# Patient Record
Sex: Male | Born: 1945
Health system: Southern US, Community
[De-identification: ages and names within clinical notes are randomized; demographics above are authoritative.]

## PROBLEM LIST (undated history)

## (undated) DIAGNOSIS — K4090 Unilateral inguinal hernia, without obstruction or gangrene, not specified as recurrent: Secondary | ICD-10-CM

## (undated) DIAGNOSIS — C61 Malignant neoplasm of prostate: Secondary | ICD-10-CM

## (undated) DIAGNOSIS — Z8546 Personal history of malignant neoplasm of prostate: Secondary | ICD-10-CM

## (undated) DIAGNOSIS — H401130 Primary open-angle glaucoma, bilateral, stage unspecified: Secondary | ICD-10-CM

## (undated) DIAGNOSIS — R351 Nocturia: Secondary | ICD-10-CM

## (undated) HISTORY — DX: Unilateral inguinal hernia, without obstruction or gangrene, not specified as recurrent: K40.90

## (undated) HISTORY — DX: Malignant neoplasm of prostate: C61

## (undated) HISTORY — DX: Nocturia: R35.1

## (undated) HISTORY — DX: Primary open-angle glaucoma, bilateral, stage unspecified: H40.1130

## (undated) HISTORY — DX: Personal history of malignant neoplasm of prostate: Z85.46

---

## 2006-03-31 ENCOUNTER — Ambulatory Visit: Payer: Self-pay | Admitting: Unknown Physician Specialty

## 2006-04-03 ENCOUNTER — Ambulatory Visit: Payer: Self-pay | Admitting: Unknown Physician Specialty

## 2008-12-05 ENCOUNTER — Ambulatory Visit: Payer: Self-pay | Admitting: Gastroenterology

## 2010-02-19 ENCOUNTER — Ambulatory Visit: Payer: Self-pay | Admitting: Radiation Oncology

## 2010-03-17 ENCOUNTER — Ambulatory Visit: Payer: Self-pay | Admitting: Radiation Oncology

## 2010-03-21 ENCOUNTER — Ambulatory Visit: Payer: Self-pay | Admitting: Radiation Oncology

## 2010-06-21 ENCOUNTER — Ambulatory Visit: Payer: Self-pay | Admitting: Radiation Oncology

## 2010-06-24 ENCOUNTER — Ambulatory Visit: Payer: Self-pay | Admitting: Radiation Oncology

## 2010-07-22 ENCOUNTER — Ambulatory Visit: Payer: Self-pay | Admitting: Radiation Oncology

## 2010-07-29 ENCOUNTER — Ambulatory Visit: Payer: Self-pay | Admitting: Radiation Oncology

## 2010-08-21 ENCOUNTER — Ambulatory Visit: Payer: Self-pay | Admitting: Radiation Oncology

## 2010-08-21 DIAGNOSIS — C61 Malignant neoplasm of prostate: Secondary | ICD-10-CM

## 2010-08-21 HISTORY — DX: Malignant neoplasm of prostate: C61

## 2010-09-01 ENCOUNTER — Ambulatory Visit: Payer: Self-pay | Admitting: Urology

## 2010-09-21 ENCOUNTER — Ambulatory Visit: Payer: Self-pay | Admitting: Radiation Oncology

## 2010-10-21 ENCOUNTER — Ambulatory Visit: Payer: Self-pay | Admitting: Radiation Oncology

## 2010-11-21 HISTORY — PX: INSERTION BRACHYTHERAPY DEVICE: SHX6582

## 2012-12-31 DIAGNOSIS — K4090 Unilateral inguinal hernia, without obstruction or gangrene, not specified as recurrent: Secondary | ICD-10-CM | POA: Insufficient documentation

## 2012-12-31 HISTORY — DX: Unilateral inguinal hernia, without obstruction or gangrene, not specified as recurrent: K40.90

## 2014-10-02 DIAGNOSIS — Z8546 Personal history of malignant neoplasm of prostate: Secondary | ICD-10-CM

## 2014-10-02 HISTORY — DX: Personal history of malignant neoplasm of prostate: Z85.46

## 2015-03-03 DIAGNOSIS — C61 Malignant neoplasm of prostate: Secondary | ICD-10-CM | POA: Diagnosis not present

## 2015-09-07 DIAGNOSIS — C61 Malignant neoplasm of prostate: Secondary | ICD-10-CM | POA: Diagnosis not present

## 2015-12-29 DIAGNOSIS — Z0000 Encounter for general adult medical examination without abnormal findings: Secondary | ICD-10-CM | POA: Diagnosis not present

## 2015-12-29 DIAGNOSIS — Z0001 Encounter for general adult medical examination with abnormal findings: Secondary | ICD-10-CM | POA: Diagnosis not present

## 2016-09-05 DIAGNOSIS — R351 Nocturia: Secondary | ICD-10-CM | POA: Diagnosis not present

## 2016-09-05 DIAGNOSIS — C61 Malignant neoplasm of prostate: Secondary | ICD-10-CM | POA: Diagnosis not present

## 2016-09-10 DIAGNOSIS — R351 Nocturia: Secondary | ICD-10-CM

## 2016-09-10 HISTORY — DX: Nocturia: R35.1

## 2016-09-19 DIAGNOSIS — Z23 Encounter for immunization: Secondary | ICD-10-CM | POA: Diagnosis not present

## 2016-09-19 DIAGNOSIS — Z1283 Encounter for screening for malignant neoplasm of skin: Secondary | ICD-10-CM | POA: Diagnosis not present

## 2016-09-19 DIAGNOSIS — Z0000 Encounter for general adult medical examination without abnormal findings: Secondary | ICD-10-CM | POA: Diagnosis not present

## 2016-11-07 DIAGNOSIS — D1801 Hemangioma of skin and subcutaneous tissue: Secondary | ICD-10-CM | POA: Diagnosis not present

## 2016-11-07 DIAGNOSIS — L700 Acne vulgaris: Secondary | ICD-10-CM | POA: Diagnosis not present

## 2016-11-07 DIAGNOSIS — D1721 Benign lipomatous neoplasm of skin and subcutaneous tissue of right arm: Secondary | ICD-10-CM | POA: Diagnosis not present

## 2016-11-07 DIAGNOSIS — L821 Other seborrheic keratosis: Secondary | ICD-10-CM | POA: Diagnosis not present

## 2016-12-30 DIAGNOSIS — Z0001 Encounter for general adult medical examination with abnormal findings: Secondary | ICD-10-CM | POA: Diagnosis not present

## 2017-05-22 DIAGNOSIS — H401113 Primary open-angle glaucoma, right eye, severe stage: Secondary | ICD-10-CM | POA: Diagnosis not present

## 2017-05-22 DIAGNOSIS — H40043 Steroid responder, bilateral: Secondary | ICD-10-CM | POA: Diagnosis not present

## 2017-05-22 DIAGNOSIS — H401121 Primary open-angle glaucoma, left eye, mild stage: Secondary | ICD-10-CM | POA: Diagnosis not present

## 2017-05-22 DIAGNOSIS — H2513 Age-related nuclear cataract, bilateral: Secondary | ICD-10-CM | POA: Diagnosis not present

## 2017-07-06 DIAGNOSIS — H401121 Primary open-angle glaucoma, left eye, mild stage: Secondary | ICD-10-CM | POA: Diagnosis not present

## 2017-07-06 DIAGNOSIS — H401113 Primary open-angle glaucoma, right eye, severe stage: Secondary | ICD-10-CM | POA: Diagnosis not present

## 2017-07-06 DIAGNOSIS — H2513 Age-related nuclear cataract, bilateral: Secondary | ICD-10-CM | POA: Diagnosis not present

## 2017-09-20 DIAGNOSIS — H401130 Primary open-angle glaucoma, bilateral, stage unspecified: Secondary | ICD-10-CM | POA: Diagnosis not present

## 2017-09-20 DIAGNOSIS — Z0000 Encounter for general adult medical examination without abnormal findings: Secondary | ICD-10-CM | POA: Diagnosis not present

## 2017-09-20 DIAGNOSIS — C61 Malignant neoplasm of prostate: Secondary | ICD-10-CM | POA: Diagnosis not present

## 2017-09-20 DIAGNOSIS — Z5181 Encounter for therapeutic drug level monitoring: Secondary | ICD-10-CM | POA: Diagnosis not present

## 2017-09-20 HISTORY — DX: Primary open-angle glaucoma, bilateral, stage unspecified: H40.1130

## 2017-11-02 DIAGNOSIS — H401133 Primary open-angle glaucoma, bilateral, severe stage: Secondary | ICD-10-CM | POA: Diagnosis not present

## 2017-11-09 ENCOUNTER — Ambulatory Visit: Payer: Medicare HMO | Admitting: Urology

## 2017-11-09 ENCOUNTER — Encounter: Payer: Self-pay | Admitting: Urology

## 2017-11-09 VITALS — BP 119/66 | HR 76 | Ht 73.0 in | Wt 185.0 lb

## 2017-11-09 DIAGNOSIS — Z8546 Personal history of malignant neoplasm of prostate: Secondary | ICD-10-CM

## 2017-11-09 NOTE — Progress Notes (Signed)
11/09/2017 3:04 PM   Jason Mcgee 1946/03/07 [id number]  Referring provider: No referring provider defined for this encounter.  Chief Complaint  Patient presents with  . Prostate Cancer    HPI: 71 year old male presents for follow-up of prostate cancer.  He was diagnosed with T1c low risk adenocarcinoma the prostate in 2011 and elected brachytherapy as treatment completed in October 2011.  PSA at diagnosis was 4.1.  His PSA has been stable in the 0.5 range.  His last PSA October 2017 was 0.55.  He denies bothersome lower urinary tract symptoms.  He has nocturia x1-2 however states he drinks 8-12 ounces of water just prior to bedtime.  He has also noted slight difficulty achieving and maintaining an erection.  He has no pain or curvature.  PMH: Past Medical History:  Diagnosis Date  . Inguinal hernia 12/31/2012  . Malignant neoplasm of prostate ([place]) 08/21/2010   Overview:  Gleeson Stage 6, s/p 1-125 interstitial implants  . Nocturia 09/10/2016  . Personal history of prostate cancer 10/02/2014  . Primary open angle glaucoma (POAG) of both eyes 09/20/2017    Surgical History: Prostate brachytherapy  Home Medications:  Allergies as of 11/09/2017   No Known Allergies     Medication List        Accurate as of 11/09/17  3:04 PM. Always use your most recent med list.          dorzolamide 2 % ophthalmic solution Commonly known as:  TRUSOPT INSTILL 1 DROP INTO BOTH EYES TWICE A DAY   latanoprost 0.005 % ophthalmic solution Commonly known as:  XALATAN       Allergies: No Known Allergies  Family History: Family History  Problem Relation Age of Onset  . Prostate cancer Neg Hx   . Bladder Cancer Neg Hx   . Kidney cancer Neg Hx     Social History:  reports that  has never smoked. he has never used smokeless tobacco. He reports that he does not drink alcohol or use drugs.  ROS: UROLOGY Frequent Urination?: No Hard to postpone urination?: No Burning/pain  with urination?: No Get up at night to urinate?: Yes Leakage of urine?: No Urine stream starts and stops?: No Trouble starting stream?: No Do you have to strain to urinate?: No Blood in urine?: No Urinary tract infection?: No Sexually transmitted disease?: No Injury to kidneys or bladder?: No Painful intercourse?: No Weak stream?: No Erection problems?: Yes Penile pain?: No  Gastrointestinal Nausea?: No Vomiting?: No Indigestion/heartburn?: No Diarrhea?: No Constipation?: No  Constitutional Fever: No Night sweats?: No Weight loss?: No Fatigue?: No  Skin Skin rash/lesions?: No Itching?: No  Eyes Blurred vision?: Yes Double vision?: No  Ears/Nose/Throat Sore throat?: No Sinus problems?: No  Hematologic/Lymphatic Swollen glands?: No Easy bruising?: No  Cardiovascular Leg swelling?: No Chest pain?: No  Respiratory Cough?: No Shortness of breath?: No  Endocrine Excessive thirst?: No  Musculoskeletal Back pain?: No Joint pain?: No  Neurological Headaches?: No Dizziness?: No  Psychologic Depression?: No Anxiety?: No  Physical Exam: BP 119/66   Pulse 76   Ht 6\' 1"  (1.854 m)   Wt 185 lb (83.9 kg)   BMI 24.41 kg/m   Constitutional:  Alert and oriented, No acute distress. HEENT:  AT, moist mucus membranes.  Trachea midline, no masses. Cardiovascular: No clubbing, cyanosis, or edema. Respiratory: Normal respiratory effort, no increased work of breathing. GI: Abdomen is soft, nontender, nondistended, no abdominal masses GU: No CVA tenderness. Skin: No rashes, bruises or  suspicious lesions. Lymph: No cervical or inguinal adenopathy. Neurologic: Grossly intact, no focal deficits, moving all 4 extremities. Psychiatric: Normal mood and affect.  Laboratory Data: PSA was drawn  Assessment & Plan:    1. History of prostate cancer Doing well.  PSA drawn today in a stable he will continue annual follow-up.  I discussed ED treatments however he  states his problem is currently not bothersome enough that he desires treatment.  - PSA   Return in about 1 year (around 11/09/2018) for Recheck, PSA.  [name], [facility] [address] [place], [place] [address] [phone]

## 2017-11-10 ENCOUNTER — Telehealth: Payer: Self-pay

## 2017-11-10 LAB — PSA: Prostate Specific Ag, Serum: 0.6 ng/mL (ref 0.0–4.0)

## 2017-11-10 NOTE — Telephone Encounter (Signed)
Patient notified of results.

## 2017-11-10 NOTE — Telephone Encounter (Signed)
-----   Message from [name], MD sent at 11/10/2017  7:42 AM EST ----- PSA remains stable at 0.6

## 2018-01-22 DIAGNOSIS — H2513 Age-related nuclear cataract, bilateral: Secondary | ICD-10-CM | POA: Diagnosis not present

## 2018-01-22 DIAGNOSIS — H401113 Primary open-angle glaucoma, right eye, severe stage: Secondary | ICD-10-CM | POA: Diagnosis not present

## 2018-01-22 DIAGNOSIS — H401121 Primary open-angle glaucoma, left eye, mild stage: Secondary | ICD-10-CM | POA: Diagnosis not present

## 2018-07-02 DIAGNOSIS — H401121 Primary open-angle glaucoma, left eye, mild stage: Secondary | ICD-10-CM | POA: Diagnosis not present

## 2018-07-02 DIAGNOSIS — H401113 Primary open-angle glaucoma, right eye, severe stage: Secondary | ICD-10-CM | POA: Diagnosis not present

## 2018-10-08 DIAGNOSIS — H401113 Primary open-angle glaucoma, right eye, severe stage: Secondary | ICD-10-CM | POA: Diagnosis not present

## 2018-10-08 DIAGNOSIS — H401121 Primary open-angle glaucoma, left eye, mild stage: Secondary | ICD-10-CM | POA: Diagnosis not present

## 2018-11-05 ENCOUNTER — Other Ambulatory Visit: Payer: Self-pay

## 2018-11-05 DIAGNOSIS — Z8546 Personal history of malignant neoplasm of prostate: Secondary | ICD-10-CM

## 2018-11-06 ENCOUNTER — Other Ambulatory Visit: Payer: Medicare HMO

## 2018-11-06 DIAGNOSIS — Z8546 Personal history of malignant neoplasm of prostate: Secondary | ICD-10-CM | POA: Diagnosis not present

## 2018-11-07 LAB — PSA: Prostate Specific Ag, Serum: 0.5 ng/mL (ref 0.0–4.0)

## 2018-11-08 ENCOUNTER — Encounter: Payer: Self-pay | Admitting: Urology

## 2018-11-08 ENCOUNTER — Ambulatory Visit: Payer: Medicare HMO | Admitting: Urology

## 2018-11-08 VITALS — BP 126/75 | HR 94 | Ht 73.0 in | Wt 185.4 lb

## 2018-11-08 DIAGNOSIS — N5201 Erectile dysfunction due to arterial insufficiency: Secondary | ICD-10-CM

## 2018-11-08 DIAGNOSIS — Z8546 Personal history of malignant neoplasm of prostate: Secondary | ICD-10-CM

## 2018-11-08 NOTE — Progress Notes (Signed)
11/08/2018 2:28 PM   Jason Mcgee 1946-10-01 [id number]  Referring provider: Baxter Hire, MD [place], [place] [address]  Chief Complaint  Patient presents with  . Follow-up  . hx of prostate cancer   Urologic history: 1.  T1c low risk adenocarcinoma the prostate diagnosed 2011; PSA 4.1 at diagnosis  -Elected treatment status post brachytherapy October 7966  HPI: 72 year old male presents for annual follow-up.  Overall he is doing well.  He denies bothersome lower urinary tract symptoms.  Denies dysuria, gross hematuria or flank/abdominal/pelvic pain.  He notes occasional mild scrotal discomfort with bladder fullness.  His PSA has remained low and stable since treatment.  PSA drawn 11/06/2018 was stable at 0.5.  He notes occasional difficulty achieving an erection.  PMH: Past Medical History:  Diagnosis Date  . Inguinal hernia 12/31/2012  . Malignant neoplasm of prostate ([place]) 08/21/2010   Overview:  Gleeson Stage 6, s/p 1-125 interstitial implants  . Nocturia 09/10/2016  . Personal history of prostate cancer 10/02/2014  . Primary open angle glaucoma (POAG) of both eyes 09/20/2017    Surgical History: Past Surgical History:  Procedure Laterality Date  . INSERTION BRACHYTHERAPY DEVICE  2012   prostate seed implant    Home Medications:  Allergies as of 11/08/2018      Reactions   Timolol Other (See Comments)   CV issues      Medication List       Accurate as of November 08, 2018  2:28 PM. Always use your most recent med list.        dorzolamide 2 % ophthalmic solution Commonly known as:  TRUSOPT INSTILL 1 DROP INTO BOTH EYES TWICE A DAY   latanoprost 0.005 % ophthalmic solution Commonly known as:  XALATAN       Allergies:  Allergies  Allergen Reactions  . Timolol Other (See Comments)    CV issues    Family History: Family History  Problem Relation Age of Onset  . Prostate cancer Neg Hx   . Bladder Cancer Neg Hx     . Kidney cancer Neg Hx     Social History:  reports that he has never smoked. He has never used smokeless tobacco. He reports that he does not drink alcohol or use drugs.  ROS: UROLOGY Frequent Urination?: No Hard to postpone urination?: No Burning/pain with urination?: No Get up at night to urinate?: No Leakage of urine?: No Urine stream starts and stops?: No Trouble starting stream?: No Do you have to strain to urinate?: No Blood in urine?: No Urinary tract infection?: No Sexually transmitted disease?: No Injury to kidneys or bladder?: No Painful intercourse?: No Weak stream?: No Erection problems?: No Penile pain?: No  Gastrointestinal Nausea?: No Vomiting?: No Indigestion/heartburn?: No Diarrhea?: No Constipation?: No  Constitutional Fever: No Night sweats?: No Weight loss?: No Fatigue?: No  Skin Skin rash/lesions?: No Itching?: No  Eyes Blurred vision?: No Double vision?: No  Ears/Nose/Throat Sore throat?: No Sinus problems?: Yes  Hematologic/Lymphatic Swollen glands?: No Easy bruising?: No  Cardiovascular Leg swelling?: No Chest pain?: No  Respiratory Cough?: No Shortness of breath?: No  Endocrine Excessive thirst?: No  Musculoskeletal Back pain?: No Joint pain?: No  Neurological Headaches?: No  Psychologic Depression?: No Anxiety?: No  Physical Exam: BP 126/75 (BP Location: Left Arm, Patient Position: Sitting)   Pulse 94   Ht 6\' 1"  (1.854 m)   Wt 185 lb 6.4 oz (84.1 kg)   BMI 24.46 kg/m  Constitutional:  Alert and oriented, No acute distress. HEENT: [place] AT, moist mucus membranes.  Trachea midline, no masses. Cardiovascular: No clubbing, cyanosis, or edema. Respiratory: Normal respiratory effort, no increased work of breathing. GI: Abdomen is soft, nontender, nondistended, no abdominal masses GU: No CVA tenderness Lymph: No cervical or inguinal lymphadenopathy. Skin: No rashes, bruises or suspicious lesions. Neurologic:  Grossly intact, no focal deficits, moving all 4 extremities. Psychiatric: Normal mood and affect.   Assessment & Plan:   72 year old male with history of prostate cancer status post brachii therapy.  PSA has remained low and stable.  Continue annual follow-up.  Should his erectile dysfunction worsen discussed starting a PDE 5 inhibitor.   Return in about 1 year (around 11/09/2019) for Recheck, PSA.  [name], [facility] [address], [place], [place] [address] [phone]

## 2018-12-25 DIAGNOSIS — H401133 Primary open-angle glaucoma, bilateral, severe stage: Secondary | ICD-10-CM | POA: Diagnosis not present

## 2018-12-25 DIAGNOSIS — Z0001 Encounter for general adult medical examination with abnormal findings: Secondary | ICD-10-CM | POA: Diagnosis not present

## 2018-12-25 DIAGNOSIS — C61 Malignant neoplasm of prostate: Secondary | ICD-10-CM | POA: Diagnosis not present

## 2018-12-25 DIAGNOSIS — Z0000 Encounter for general adult medical examination without abnormal findings: Secondary | ICD-10-CM | POA: Diagnosis not present

## 2019-01-02 DIAGNOSIS — Z0001 Encounter for general adult medical examination with abnormal findings: Secondary | ICD-10-CM | POA: Diagnosis not present

## 2019-01-28 DIAGNOSIS — H401113 Primary open-angle glaucoma, right eye, severe stage: Secondary | ICD-10-CM | POA: Diagnosis not present

## 2019-01-28 DIAGNOSIS — H401121 Primary open-angle glaucoma, left eye, mild stage: Secondary | ICD-10-CM | POA: Diagnosis not present

## 2019-01-28 DIAGNOSIS — H2513 Age-related nuclear cataract, bilateral: Secondary | ICD-10-CM | POA: Diagnosis not present

## 2019-06-03 DIAGNOSIS — H401113 Primary open-angle glaucoma, right eye, severe stage: Secondary | ICD-10-CM | POA: Diagnosis not present

## 2019-06-03 DIAGNOSIS — H401121 Primary open-angle glaucoma, left eye, mild stage: Secondary | ICD-10-CM | POA: Diagnosis not present

## 2019-10-07 DIAGNOSIS — H401113 Primary open-angle glaucoma, right eye, severe stage: Secondary | ICD-10-CM | POA: Diagnosis not present

## 2019-10-07 DIAGNOSIS — H401121 Primary open-angle glaucoma, left eye, mild stage: Secondary | ICD-10-CM | POA: Diagnosis not present

## 2019-11-07 ENCOUNTER — Other Ambulatory Visit: Payer: Self-pay | Admitting: *Deleted

## 2019-11-07 DIAGNOSIS — Z8546 Personal history of malignant neoplasm of prostate: Secondary | ICD-10-CM

## 2019-11-11 ENCOUNTER — Other Ambulatory Visit: Payer: Self-pay

## 2019-11-11 ENCOUNTER — Other Ambulatory Visit: Payer: Medicare HMO

## 2019-11-11 DIAGNOSIS — Z8546 Personal history of malignant neoplasm of prostate: Secondary | ICD-10-CM

## 2019-11-12 LAB — PSA: Prostate Specific Ag, Serum: 0.5 ng/mL (ref 0.0–4.0)

## 2019-11-13 ENCOUNTER — Ambulatory Visit: Payer: Medicare HMO | Admitting: Urology

## 2019-11-19 ENCOUNTER — Ambulatory Visit: Payer: Medicare HMO | Admitting: Urology

## 2019-11-19 ENCOUNTER — Encounter: Payer: Self-pay | Admitting: Urology

## 2019-11-19 ENCOUNTER — Other Ambulatory Visit: Payer: Self-pay

## 2019-11-19 VITALS — BP 127/76 | HR 66 | Ht 73.0 in | Wt 186.0 lb

## 2019-11-19 DIAGNOSIS — Z8546 Personal history of malignant neoplasm of prostate: Secondary | ICD-10-CM

## 2019-11-19 DIAGNOSIS — N529 Male erectile dysfunction, unspecified: Secondary | ICD-10-CM | POA: Insufficient documentation

## 2019-11-19 MED ORDER — TADALAFIL 20 MG PO TABS: ORAL_TABLET | ORAL | 0 refills | Status: DC

## 2019-11-19 NOTE — Progress Notes (Addendum)
11/19/2019 2:54 PM   Jason Mcgee 1946/06/21 [id number]  Referring provider: Baxter Hire, MD [place],  [place] [address]  Chief Complaint  Patient presents with  . Follow-up  . Erectile Dysfunction    Urologic history: 1.  T1c low risk adenocarcinoma the prostate diagnosed 2011; PSA 4.1 at diagnosis             -Elected treatment status post brachytherapy October 2011  HPI: 73 y.o. male presents for annual follow-up.  He denies bothersome lower urinary tract symptoms.  No dysuria, gross hematuria or flank, abdominal, pelvic pain.  Over the past 1-2 years he has noted intermittent ED however it has been worse the past 1-2 months.  He will have difficulty achieving and maintaining erection.  He does relate to some performance anxiety over this problem.  No significant organic risk factors.  Denies tobacco use-previous or current.  PSA drawn earlier this month remains stable at 0.5   PMH: Past Medical History:  Diagnosis Date  . Inguinal hernia 12/31/2012  . Malignant neoplasm of prostate ([place]) 08/21/2010   Overview:  Gleeson Stage 6, s/p 1-125 interstitial implants  . Nocturia 09/10/2016  . Personal history of prostate cancer 10/02/2014  . Primary open angle glaucoma (POAG) of both eyes 09/20/2017    Surgical History: Past Surgical History:  Procedure Laterality Date  . INSERTION BRACHYTHERAPY DEVICE  2012   prostate seed implant    Home Medications:  Allergies as of 11/19/2019      Reactions   Timolol Other (See Comments)   CV issues      Medication List       Accurate as of November 19, 2019  2:54 PM. If you have any questions, ask your nurse or doctor.        Boostrix 5-2.5-18.5 LF-MCG/0.5 injection Generic drug: Tdap   brimonidine 0.2 % ophthalmic solution Commonly known as: ALPHAGAN   dorzolamide 2 % ophthalmic solution Commonly known as: TRUSOPT INSTILL 1 DROP INTO BOTH EYES TWICE A DAY   Fluzone High-Dose  Quadrivalent 0.7 ML Susy Generic drug: Influenza Vac High-Dose Quad   latanoprost 0.005 % ophthalmic solution Commonly known as: XALATAN       Allergies:  Allergies  Allergen Reactions  . Timolol Other (See Comments)    CV issues    Family History: Family History  Problem Relation Age of Onset  . Prostate cancer Neg Hx   . Bladder Cancer Neg Hx   . Kidney cancer Neg Hx     Social History:  reports that he has never smoked. He has never used smokeless tobacco. He reports that he does not drink alcohol or use drugs.  ROS: UROLOGY Frequent Urination?: No Hard to postpone urination?: No Burning/pain with urination?: No Get up at night to urinate?: Yes Leakage of urine?: No Urine stream starts and stops?: No Trouble starting stream?: No Do you have to strain to urinate?: No Blood in urine?: No Urinary tract infection?: No Sexually transmitted disease?: No Injury to kidneys or bladder?: No Painful intercourse?: No Weak stream?: No Erection problems?: Yes Penile pain?: No  Gastrointestinal Nausea?: No Vomiting?: No Indigestion/heartburn?: No Diarrhea?: No Constipation?: No  Constitutional Fever: No Night sweats?: No Weight loss?: No Fatigue?: No  Skin Skin rash/lesions?: No Itching?: No  Eyes Blurred vision?: No Double vision?: No  Ears/Nose/Throat Sore throat?: No Sinus problems?: No  Hematologic/Lymphatic Swollen glands?: No Easy bruising?: No  Cardiovascular Leg swelling?: No Chest pain?: No  Respiratory Cough?: No Shortness of breath?: No  Endocrine Excessive thirst?: No  Musculoskeletal Back pain?: No Joint pain?: No  Neurological Headaches?: No Dizziness?: No  Psychologic Depression?: No Anxiety?: No  Physical Exam: BP 127/76 (BP Location: Left Arm, Patient Position: Sitting, Cuff Size: Normal)   Pulse 66   Ht 6\' 1"  (1.854 m)   Wt 186 lb (84.4 kg)   BMI 24.54 kg/m   Constitutional:  Alert and oriented, No acute  distress. HEENT: [place] AT, moist mucus membranes.  Trachea midline, no masses. Cardiovascular: No clubbing, cyanosis, or edema. Respiratory: Normal respiratory effort, no increased work of breathing. Skin: No rashes, bruises or suspicious lesions. Neurologic: Grossly intact, no focal deficits, moving all 4 extremities. Psychiatric: Normal mood and affect.   Assessment & Plan:    - Personal history prostate cancer PSA remains low and stable.  He will continue annual follow-up.  - Erectile dysfunction No significant organic risk factors.  We discussed the majority of ED is secondary to vascular abnormalities.  We also discussed that new onset ED can be a harbinger of coronary artery disease and would recommend he discuss with Dr. Edwina Barth at his next visit.  He was interested and a trial of PDE 5 inhibitor  Rx Cialis 20 mg 1 tab 1 hour prior to intercourse sent to [facility] with good Rx coupon.     [name], [facility] [address] [place], [place] [address] [phone]

## 2019-11-21 ENCOUNTER — Ambulatory Visit: Payer: Medicare HMO | Admitting: Urology

## 2019-12-31 DIAGNOSIS — Z0001 Encounter for general adult medical examination with abnormal findings: Secondary | ICD-10-CM | POA: Diagnosis not present

## 2019-12-31 DIAGNOSIS — H401133 Primary open-angle glaucoma, bilateral, severe stage: Secondary | ICD-10-CM | POA: Diagnosis not present

## 2019-12-31 DIAGNOSIS — Z23 Encounter for immunization: Secondary | ICD-10-CM | POA: Diagnosis not present

## 2020-01-01 DIAGNOSIS — Z125 Encounter for screening for malignant neoplasm of prostate: Secondary | ICD-10-CM | POA: Diagnosis not present

## 2020-01-01 DIAGNOSIS — Z0001 Encounter for general adult medical examination with abnormal findings: Secondary | ICD-10-CM | POA: Diagnosis not present

## 2020-01-30 DIAGNOSIS — H2513 Age-related nuclear cataract, bilateral: Secondary | ICD-10-CM | POA: Diagnosis not present

## 2020-01-30 DIAGNOSIS — H401121 Primary open-angle glaucoma, left eye, mild stage: Secondary | ICD-10-CM | POA: Diagnosis not present

## 2020-01-30 DIAGNOSIS — H401113 Primary open-angle glaucoma, right eye, severe stage: Secondary | ICD-10-CM | POA: Diagnosis not present

## 2020-06-29 DIAGNOSIS — H401113 Primary open-angle glaucoma, right eye, severe stage: Secondary | ICD-10-CM | POA: Diagnosis not present

## 2020-06-29 DIAGNOSIS — H401121 Primary open-angle glaucoma, left eye, mild stage: Secondary | ICD-10-CM | POA: Diagnosis not present

## 2020-06-29 DIAGNOSIS — H2513 Age-related nuclear cataract, bilateral: Secondary | ICD-10-CM | POA: Diagnosis not present

## 2020-07-09 DIAGNOSIS — Z1211 Encounter for screening for malignant neoplasm of colon: Secondary | ICD-10-CM | POA: Diagnosis not present

## 2020-07-16 LAB — EXTERNAL GENERIC LAB PROCEDURE: COLOGUARD: NEGATIVE

## 2020-07-16 LAB — COLOGUARD: COLOGUARD: NEGATIVE

## 2020-08-19 DIAGNOSIS — Z23 Encounter for immunization: Secondary | ICD-10-CM | POA: Diagnosis not present

## 2020-10-05 DIAGNOSIS — H401121 Primary open-angle glaucoma, left eye, mild stage: Secondary | ICD-10-CM | POA: Diagnosis not present

## 2020-10-05 DIAGNOSIS — H2513 Age-related nuclear cataract, bilateral: Secondary | ICD-10-CM | POA: Diagnosis not present

## 2020-10-05 DIAGNOSIS — H401113 Primary open-angle glaucoma, right eye, severe stage: Secondary | ICD-10-CM | POA: Diagnosis not present

## 2020-11-18 ENCOUNTER — Encounter: Payer: Self-pay | Admitting: Urology

## 2020-11-18 ENCOUNTER — Ambulatory Visit: Payer: Medicare HMO | Admitting: Urology

## 2020-11-18 ENCOUNTER — Other Ambulatory Visit: Payer: Self-pay

## 2020-11-18 VITALS — BP 116/70 | HR 59 | Ht 73.0 in | Wt 180.0 lb

## 2020-11-18 DIAGNOSIS — R972 Elevated prostate specific antigen [PSA]: Secondary | ICD-10-CM | POA: Diagnosis not present

## 2020-11-18 DIAGNOSIS — N5202 Corporo-venous occlusive erectile dysfunction: Secondary | ICD-10-CM | POA: Diagnosis not present

## 2020-11-18 DIAGNOSIS — K4090 Unilateral inguinal hernia, without obstruction or gangrene, not specified as recurrent: Secondary | ICD-10-CM

## 2020-11-18 DIAGNOSIS — C61 Malignant neoplasm of prostate: Secondary | ICD-10-CM

## 2020-11-18 MED ORDER — TADALAFIL 20 MG PO TABS: ORAL_TABLET | ORAL | 0 refills | Status: DC

## 2020-11-18 NOTE — Progress Notes (Signed)
11/18/2020 2:19 PM   Jason Mcgee [birth year] [id number]  Referring provider: Baxter Hire, MD [place],  [place] [address]  Chief Complaint  Patient presents with  . Prostate Cancer    Urologic history: 1.T1c low risk adenocarcinoma the prostate  -diagnosed 2011; PSA 4.1 at diagnosis -Elected treatment status post brachytherapy October 2011  2.  Erectile dysfunction  HPI: 74 y.o. male presents for annual follow-up.  No bothersome LUTS  Denies dysuria, gross hematuria  Since last years visit with worsening erectile dysfunction  Can usually achieve penetration but has difficulty maintaining the erection; no real improvement on tadalafil 20 mg  Also notes delayed seminal emission  No pain with ejaculation  Has also noted a mass in his right groin area that he wanted to have checked   PMH: Past Medical History:  Diagnosis Date  . Inguinal hernia 12/31/2012  . Malignant neoplasm of prostate ([place]) 08/21/2010   Overview:  Gleeson Stage 6, s/p 1-125 interstitial implants  . Nocturia 09/10/2016  . Personal history of prostate cancer 10/02/2014  . Primary open angle glaucoma (POAG) of both eyes 09/20/2017    Surgical History: Past Surgical History:  Procedure Laterality Date  . INSERTION BRACHYTHERAPY DEVICE  2012   prostate seed implant    Home Medications:  Allergies as of 11/18/2020      Reactions   Timolol Other (See Comments)   CV issues      Medication List       Accurate as of November 18, 2020  2:19 PM. If you have any questions, ask your nurse or doctor.        STOP taking these medications   Boostrix 5-2.5-18.5 LF-MCG/0.5 injection Generic drug: Tdap Stopped by: [name], MD   Fluzone High-Dose Quadrivalent 0.7 ML Susy Generic drug: Influenza Vac High-Dose Quad Stopped by: [name], MD   tadalafil 20 MG tablet Commonly known as: CIALIS Stopped by: [name], MD     TAKE these  medications   brimonidine 0.2 % ophthalmic solution Commonly known as: ALPHAGAN   dorzolamide 2 % ophthalmic solution Commonly known as: TRUSOPT INSTILL 1 DROP INTO BOTH EYES TWICE A DAY   latanoprost 0.005 % ophthalmic solution Commonly known as: XALATAN       Allergies:  Allergies  Allergen Reactions  . Timolol Other (See Comments)    CV issues    Family History: Family History  Problem Relation Age of Onset  . Prostate cancer Neg Hx   . Bladder Cancer Neg Hx   . Kidney cancer Neg Hx     Social History:  reports that he has never smoked. He has never used smokeless tobacco. He reports that he does not drink alcohol and does not use drugs.   Physical Exam: BP 116/70   Pulse (!) 59   Ht 6\' 1"  (1.854 m)   Wt 180 lb (81.6 kg)   BMI 23.75 kg/m   Constitutional:  Alert and oriented, No acute distress. HEENT: [place] AT, moist mucus membranes.  Trachea midline, no masses. Cardiovascular: No clubbing, cyanosis, or edema. Respiratory: Normal respiratory effort, no increased work of breathing. GU: Easily reducible right inguinal hernia Skin: No rashes, bruises or suspicious lesions. Neurologic: Grossly intact, no focal deficits, moving all 4 extremities. Psychiatric: Normal mood and affect.   Assessment & Plan:    1.  T1c low risk prostate cancer  Doing well status post brachytherapy  PSA drawn today  2.  Erectile dysfunction  Able to achieve penetration though difficulty maintaining which may be secondary to venoocclusive disease  Recommended a trial of a venous compression band  He may also use in conjunction with tadalafil and refill sent to pharmacy  He will call back regarding efficacy  3.  Right inguinal hernia  Asymptomatic and not interested in repair or general surgical referral  Discussed signs of incarceration and if occur would need urgent evaluation   Continue annual follow-up   Riki Altes, MD  [facility] [address], [address] [place], [place] [address] [phone]

## 2020-11-19 LAB — PSA: Prostate Specific Ag, Serum: 0.6 ng/mL (ref 0.0–4.0)

## 2020-11-21 ENCOUNTER — Encounter: Payer: Self-pay | Admitting: Urology

## 2020-11-21 DIAGNOSIS — N5202 Corporo-venous occlusive erectile dysfunction: Secondary | ICD-10-CM | POA: Insufficient documentation

## 2020-11-23 ENCOUNTER — Telehealth: Payer: Self-pay | Admitting: *Deleted

## 2020-11-23 NOTE — Telephone Encounter (Signed)
Notified patient as instructed, patient pleased. Discussed follow-up appointments, patient agrees  

## 2020-11-23 NOTE — Telephone Encounter (Signed)
-----   Message from Riki Altes, MD sent at 11/23/2020 10:21 AM EST ----- PSA stable 0.6

## 2020-11-26 DIAGNOSIS — H401113 Primary open-angle glaucoma, right eye, severe stage: Secondary | ICD-10-CM | POA: Diagnosis not present

## 2020-12-23 DIAGNOSIS — Z0001 Encounter for general adult medical examination with abnormal findings: Secondary | ICD-10-CM | POA: Diagnosis not present

## 2020-12-23 DIAGNOSIS — Z13220 Encounter for screening for lipoid disorders: Secondary | ICD-10-CM | POA: Diagnosis not present

## 2021-01-01 DIAGNOSIS — H401133 Primary open-angle glaucoma, bilateral, severe stage: Secondary | ICD-10-CM | POA: Diagnosis not present

## 2021-01-01 DIAGNOSIS — Z8546 Personal history of malignant neoplasm of prostate: Secondary | ICD-10-CM | POA: Diagnosis not present

## 2021-01-01 DIAGNOSIS — Z0000 Encounter for general adult medical examination without abnormal findings: Secondary | ICD-10-CM | POA: Diagnosis not present

## 2021-01-01 DIAGNOSIS — H8113 Benign paroxysmal vertigo, bilateral: Secondary | ICD-10-CM | POA: Diagnosis not present

## 2021-01-01 DIAGNOSIS — Z0001 Encounter for general adult medical examination with abnormal findings: Secondary | ICD-10-CM | POA: Diagnosis not present

## 2021-01-22 ENCOUNTER — Other Ambulatory Visit: Payer: Self-pay | Admitting: Otolaryngology

## 2021-01-22 DIAGNOSIS — R42 Dizziness and giddiness: Secondary | ICD-10-CM

## 2021-01-22 DIAGNOSIS — H903 Sensorineural hearing loss, bilateral: Secondary | ICD-10-CM | POA: Diagnosis not present

## 2021-01-28 DIAGNOSIS — H401133 Primary open-angle glaucoma, bilateral, severe stage: Secondary | ICD-10-CM | POA: Diagnosis not present

## 2021-02-08 ENCOUNTER — Ambulatory Visit
Admission: RE | Admit: 2021-02-08 | Discharge: 2021-02-08 | Disposition: A | Discharge status: Home / Self Care | Payer: Medicare HMO | Source: Ambulatory Visit | Attending: Otolaryngology | Admitting: Otolaryngology

## 2021-02-08 ENCOUNTER — Other Ambulatory Visit: Payer: Self-pay

## 2021-02-08 DIAGNOSIS — R42 Dizziness and giddiness: Secondary | ICD-10-CM | POA: Diagnosis not present

## 2021-02-08 DIAGNOSIS — R519 Headache, unspecified: Secondary | ICD-10-CM | POA: Diagnosis not present

## 2021-02-08 MED ORDER — GADOBUTROL 1 MMOL/ML IV SOLN
8.0000 mL | Freq: Once | INTRAVENOUS | Status: AC | PRN
  Administered 2021-02-08: 8 mL via INTRAVENOUS

## 2021-02-24 DIAGNOSIS — R42 Dizziness and giddiness: Secondary | ICD-10-CM | POA: Diagnosis not present

## 2021-03-17 DIAGNOSIS — H8109 Meniere's disease, unspecified ear: Secondary | ICD-10-CM | POA: Diagnosis not present

## 2021-03-17 DIAGNOSIS — J301 Allergic rhinitis due to pollen: Secondary | ICD-10-CM | POA: Diagnosis not present

## 2021-04-29 DIAGNOSIS — H401133 Primary open-angle glaucoma, bilateral, severe stage: Secondary | ICD-10-CM | POA: Diagnosis not present

## 2021-07-13 DIAGNOSIS — H8101 Meniere's disease, right ear: Secondary | ICD-10-CM | POA: Diagnosis not present

## 2021-07-13 DIAGNOSIS — J301 Allergic rhinitis due to pollen: Secondary | ICD-10-CM | POA: Diagnosis not present

## 2021-08-25 DIAGNOSIS — J301 Allergic rhinitis due to pollen: Secondary | ICD-10-CM | POA: Diagnosis not present

## 2021-08-25 DIAGNOSIS — H8101 Meniere's disease, right ear: Secondary | ICD-10-CM | POA: Diagnosis not present

## 2021-09-02 DIAGNOSIS — H401113 Primary open-angle glaucoma, right eye, severe stage: Secondary | ICD-10-CM | POA: Diagnosis not present

## 2021-11-02 DIAGNOSIS — U071 COVID-19: Secondary | ICD-10-CM | POA: Diagnosis not present

## 2021-11-18 ENCOUNTER — Encounter: Payer: Self-pay | Admitting: Urology

## 2021-11-18 ENCOUNTER — Ambulatory Visit: Payer: Medicare HMO | Admitting: Urology

## 2021-11-18 ENCOUNTER — Other Ambulatory Visit: Payer: Self-pay

## 2021-11-18 VITALS — BP 124/77 | HR 89 | Ht 73.0 in | Wt 180.0 lb

## 2021-11-18 DIAGNOSIS — C61 Malignant neoplasm of prostate: Secondary | ICD-10-CM | POA: Diagnosis not present

## 2021-11-18 DIAGNOSIS — R399 Unspecified symptoms and signs involving the genitourinary system: Secondary | ICD-10-CM

## 2021-11-18 DIAGNOSIS — N5319 Other ejaculatory dysfunction: Secondary | ICD-10-CM | POA: Diagnosis not present

## 2021-11-18 LAB — URINALYSIS, COMPLETE
Bilirubin, UA: NEGATIVE
Glucose, UA: NEGATIVE
Ketones, UA: NEGATIVE
Leukocytes,UA: NEGATIVE
Nitrite, UA: NEGATIVE
Protein,UA: NEGATIVE
RBC, UA: NEGATIVE
Specific Gravity, UA: 1.025 (ref 1.005–1.030)
Urobilinogen, Ur: 0.2 mg/dL (ref 0.2–1.0)
pH, UA: 5.5 (ref 5.0–7.5)

## 2021-11-18 LAB — BLADDER SCAN AMB NON-IMAGING: SCA Result: 0

## 2021-11-18 LAB — MICROSCOPIC EXAMINATION
Bacteria, UA: NONE SEEN
Epithelial Cells (non renal): NONE SEEN /hpf (ref 0–10)

## 2021-11-18 NOTE — Progress Notes (Signed)
11/18/2021 2:32 PM   Jason Mcgee 1946-07-22 [id number]  Referring provider: Baxter Hire, MD [place],  [place] [address]  Chief Complaint  Patient presents with   Prostate Cancer   Urologic history: 1.  T1c low risk adenocarcinoma the prostate  -diagnosed 2011; PSA 4.1 at diagnosis -Elected treatment status post brachytherapy October 2011   2.  Erectile dysfunction  HPI: 75 y.o. male who presents for annual follow-up.  Doing well since last visit He has noted since his visit last year mild decreased force and caliber of his urinary stream Denies dysuria, gross hematuria Denies flank, abdominal or pelvic pain He also notes after orgasm some delay in appearance of the ejaculate.  No pain with ejaculation  PMH: Past Medical History:  Diagnosis Date   Inguinal hernia 12/31/2012   Malignant neoplasm of prostate ([name]) 08/21/2010   Overview:  [name] Stage 6, s/p 1-125 interstitial implants   Nocturia 09/10/2016   Personal history of prostate cancer 10/02/2014   Primary open angle glaucoma (POAG) of both eyes 09/20/2017    Surgical History: Past Surgical History:  Procedure Laterality Date   INSERTION BRACHYTHERAPY DEVICE  2012   prostate seed implant    Home Medications:  Allergies as of 11/18/2021       Reactions   Timolol Other (See Comments)   CV issues        Medication List        Accurate as of November 18, 2021  2:32 PM. If you have any questions, ask your nurse or doctor.          brimonidine 0.2 % ophthalmic solution Commonly known as: ALPHAGAN   dorzolamide 2 % ophthalmic solution Commonly known as: TRUSOPT INSTILL 1 DROP INTO BOTH EYES TWICE A DAY   latanoprost 0.005 % ophthalmic solution Commonly known as: XALATAN   tadalafil 20 MG tablet Commonly known as: CIALIS 1 tab po as needed 1 hour prior to intercourse        Allergies:  Allergies  Allergen Reactions   Timolol Other (See Comments)     CV issues    Family History: Family History  Problem Relation Age of Onset   Prostate cancer Neg Hx    Bladder Cancer Neg Hx    Kidney cancer Neg Hx     Social History:  reports that he has never smoked. He has never used smokeless tobacco. He reports that he does not drink alcohol and does not use drugs.   Physical Exam: BP 124/77    Pulse 89    Ht 6\' 1"  (1.854 m)    Wt 180 lb (81.6 kg)    BMI 23.75 kg/m   Constitutional:  Alert and oriented, No acute distress. HEENT: [name], moist mucus membranes.  Trachea midline, no masses. Cardiovascular: No clubbing, cyanosis, or edema. Respiratory: Normal respiratory effort, no increased work of breathing. Psychiatric: Normal mood and affect.  Laboratory Data:  Urinalysis Appearance yellow, clear Dipstick negative Microscopy negative   Assessment & Plan:    1.  T1c low risk prostate cancer Status post brachytherapy PSA today  2.  Lower urinary tract symptoms Mild obstructive voiding symptoms Presently not bothersome Bladder scan PVR 0 mL If his voiding symptoms worsen he was instructed to call and would recommend cystoscopy to evaluate for possibility of urethral stricture  3.  Delayed seminal emission Could potentially be related to a urethral stricture as above but also secondary to prior prostate brachytherapy  [name], [facility] [address], [place], [place] [address] [phone]

## 2021-11-19 ENCOUNTER — Telehealth: Payer: Self-pay | Admitting: *Deleted

## 2021-11-19 DIAGNOSIS — C61 Malignant neoplasm of prostate: Secondary | ICD-10-CM

## 2021-11-19 LAB — PSA: Prostate Specific Ag, Serum: 1 ng/mL (ref 0.0–4.0)

## 2021-11-19 NOTE — Telephone Encounter (Signed)
-----   Message from [name], MD sent at 11/19/2021  7:15 AM EST ----- PSA has increased slightly and was 1.0.  Recommend 40-month lab visit with repeat PSA

## 2021-11-23 DIAGNOSIS — J301 Allergic rhinitis due to pollen: Secondary | ICD-10-CM | POA: Diagnosis not present

## 2021-11-23 DIAGNOSIS — H8101 Meniere's disease, right ear: Secondary | ICD-10-CM | POA: Diagnosis not present

## 2021-11-23 NOTE — Telephone Encounter (Signed)
Notified patient as instructed, patient pleased. Discussed follow-up appointments, patient agrees  

## 2021-11-30 ENCOUNTER — Ambulatory Visit: Payer: Medicare HMO | Attending: Otolaryngology | Admitting: Physical Therapy

## 2021-11-30 ENCOUNTER — Encounter: Payer: Self-pay | Admitting: Physical Therapy

## 2021-11-30 ENCOUNTER — Other Ambulatory Visit: Payer: Self-pay

## 2021-11-30 DIAGNOSIS — R2681 Unsteadiness on feet: Secondary | ICD-10-CM | POA: Insufficient documentation

## 2021-11-30 DIAGNOSIS — R42 Dizziness and giddiness: Secondary | ICD-10-CM | POA: Diagnosis not present

## 2021-11-30 NOTE — Therapy (Signed)
[place] MAIN [facility] SERVICES [address] [place], [place], [address] Phone: [phone]   Fax:  [phone]  Physical Therapy Evaluation  Patient Details  Name: Jason Mcgee MRN: [id number] Date of Birth: 1946-07-08 Referring Provider (PT): Dr. Carmin Richmond   Encounter Date: 11/30/2021   PT End of Session - 11/30/21 1544     Visit Number 1    Number of Visits 9    Date for PT Re-Evaluation 01/25/22    PT Start Time 1304    PT Stop Time 1410    PT Time Calculation (min) 66 min    Equipment Utilized During Treatment Gait belt    Activity Tolerance Patient tolerated treatment well    Behavior During Therapy [facility] for tasks assessed/performed             Past Medical History:  Diagnosis Date   Inguinal hernia 12/31/2012   Malignant neoplasm of prostate ([place]) 08/21/2010   Overview:  [name] Stage 6, s/p 1-125 interstitial implants   Nocturia 09/10/2016   Personal history of prostate cancer 10/02/2014   Primary open angle glaucoma (POAG) of both eyes 09/20/2017    Past Surgical History:  Procedure Laterality Date   INSERTION BRACHYTHERAPY DEVICE  2012   prostate seed implant    There were no vitals filed for this visit.     [facility] PT Assessment - 11/30/21 1345       Assessment   Medical Diagnosis dizziness and giddiness    Referring Provider (PT) Dr. Carmin Richmond    Prior Therapy no prior vestibular therapy      Precautions   Precautions None      Restrictions   Weight Bearing Restrictions No      Balance Screen   Has the patient fallen in the past 6 months No    Has the patient had a decrease in activity level because of a fear of falling?  No    Is the patient reluctant to leave their home because of a fear of falling?  No      Home Environment   Living Environment Private residence    Living Arrangements Spouse/significant other    Available Help at Discharge Family;Friend(s)    Type of [place] to enter    Entrance Stairs-Number of Steps 2    Home Layout Multi-level      Prior Function   Level of Independence Independent with community mobility without device;Independent with gait;Independent with transfers;Independent      Cognition   Overall Cognitive Status Within Functional Limits for tasks assessed      Standardized Balance Assessment   Standardized Balance Assessment Dynamic Gait Index      Dynamic Gait Index   Level Surface Normal    Change in Gait Speed Normal    Gait with Horizontal Head Turns Mild Impairment    Gait with Vertical Head Turns Normal    Gait and Pivot Turn Normal    Step Over Obstacle Mild Impairment    Step Around Obstacles Normal    Steps Mild Impairment    Total Score 21             PCP: Baxter Hire, MD REFERRING PROVIDER: Carloyn Manner, MD  Vestibular Evaluation SUBJECTIVE:   SUBJECTIVE STATEMENT: Patient states that he has had dizziness episodes for years, but states that his symptoms of gotten more prevalent in the past 2 to 3 years.                                                                                                                                                                                                              PERTINENT HISTORY:  Pt reports he has had dizziness off and on for years, but it has become more prevalent in the past 2 or 3 years. Patient has been seen by Dr. Pryor Ochoa, ENT physician, and has had VNG testing. Pt with diagnosis of Meniere's disease, acute vestibular right ear per MR. Patient states he is having vertigo and balance difficulties which prompted him to see Dr. Pryor Ochoa. Pt reports his episodes of dizziness have been happening every 3-5 months. During an episode, pt reports he cannot walk in a straight line and feels like he is going to fall. Patient plays tennis and when he has an episode he cannot play. Pt reports that bending over, standing up,  looking up at the ceiling, looking up to serve the tennis ball, quick turns, turning around all aggravate his symptoms and that resting, slowing his movements and taking sinus medicine help to decrease his symptoms. These episodes last a few days and he feels fatigued and wants to lie down. He has had 2 episodes this past year. Patient states he uses rails when navigating on stairs for balance. Patient reports that he does not have any hearing issues. Pt states that the unsteadiness makes him unsure and that it affects how he walks. Reports he has difficulty distinguishing sinus difficulties acting up and gets stuffy feeling. Pt reports that he has glaucoma and is not sure if some of symptoms could be due to glaucoma or sinus issues. Pt reports puffy sensation behind his eyes. Pt reports that he has had blood work for allergy testing at Dr. Darien Ramus office but has not had appointment to review results yet.  Ears symptoms: no pain or drainage Eyes symptoms: reports when having the severe vertigo gets blurry and "almost see double if I am lying on my side"; reports his eyes water a lot; seen eye doctor within last year and sees for the glaucoma 2-3 times a year.    Type of dizziness: Imbalance (Disequilibrium), Spinning/Vertigo, and Unsteady with head/body turns   Frequency: 2-3 times a year   Duration: days   Aggravating factors: Induced by position  change: sit to stand and bending over and Induced by motion: occur when walking, looking up at the ceiling, bending down to the ground, turning body  quickly, and turning head quickly  turning, sudden movements, bending over and standing upfear of heights so unsure when standing at top of stairs and coming down if he does not have a railing.    Relieving factors: rest and slow movements taking sinus medicine which he takes as needed; slowing movements   Progression of symptoms: worse  PAIN:  Are you having pain? No  PRECAUTIONS: None  WEIGHT BEARING  RESTRICTIONS No  FALLS: Has patient fallen in last 6 months? No, Number of falls: 0  LIVING ENVIRONMENT: Lives with: lives with his spouse Lives in: house Stairs: Yes; External: 2 steps; none; has a basement and a second floor 1 flight of steps with railings Has following equipment at home: None  PLOF: Independent with community mobility without device  PATIENT GOALS : anything to help him deal with it.  Plays two to three times a week and would like to be able to do overhand serve with decreased or no symptoms  OBJECTIVE:   COGNITION: Overall cognitive status: Within functional limits for tasks assessed Commands: Follows multi-step commands consistently Attention: Appears intact Awareness: Appears intact Behavior: Within functional limits   SENSATION: Light touch: Appears intact; denies numbness and tingling   POSTURE: No Significant postural limitations   Cervical AROM/PROM:   Cervical AROM WFL left and right rotation  STRENGTH: deferred   TRANSFERS: Independent with sit to stand transfers  GAIT: Gait pattern: [facility] Assistive device utilized: None Level of assistance: Complete Independence Comments: Ambulates with step through gait pattern with reciprocal arm swing without veering on firm surface with fair scanning of the visual environment.   FUNCTIONAL TESTs:  Clinical Test of Sensory Interaction for Balance (CTSIB):  CONDITION TIME STRATEGY SWAY  Eyes open, firm surface 30 seconds ankle +1  Eyes closed, firm surface 30 seconds ankle +2  Eyes open, foam surface 30 seconds ankle +2  Eyes closed, foam surface 30 seconds Ankle, hip +3    FUNCTIONAL OUTCOME MEASURES:  Results Comments  DHI 16/100 Low perception of handicap  ABC Scale 92% Safe for community mobility  DGI 21/24 Falls risk; in need of intervention  FOTO 65/100 Like patients nationally had a score of 56/100    VESTIBULAR ASSESSMENT    OCULOMOTOR EXAM:   Ocular Alignment: normal   Ocular  ROM: No Limitations   Spontaneous Nystagmus: absent   Gaze-Induced Nystagmus: absent   Smooth Pursuits: intact   Saccades:  mild decreased speed with upper field good rest of the fields   VESTIBULAR - OCULAR REFLEX:    VOR Cancellation: Normal   Head-Impulse Test: HIT Right: one corrective saccade noted HIT Left: negative    POSITIONAL TESTING: Other: deferred   VESTIBULAR TREATMENT: Gaze Adaptation:   x1 Viewing Horizontal: Position: 1 rep in sitting and 1 rep in standing with plain background, Time: 1 minute, Reps: 2 reps, and Comment: pt denied dizziness  Neuromuscular Re-education:  VOR X 1 exercise:  Demonstrated and educated as to VOR X1.  Patient performed VOR X 1 horizontal in sitting 1 rep of 1 minute with verbal cues for technique. Pt had difficulty initially with keeping eyes fixed on the target.  Patient performed VOR X 1 horizontal in standing 1 rep of 1 minute. Pt denied dizziness.   Access Code: [facility] URL: [email or web address] Date: 11/30/2021 Prepared by: Lady Deutscher  Exercises Standing Gaze Stabilization with Head Rotation - 3 x daily - 7 x weekly - 1 sets - 3 reps - 1 minute hold   PT Education - 11/30/21 1519     Education Details discussed plan of care and goals; issued VOR X 1 in standing with plain background via [place]; provided Meniere's disease handout from [email or web address]    Person(s) Educated Patient    Methods Explanation;Demonstration;Handout    Comprehension Verbalized understanding;Returned demonstration              PT Short Term Goals - 11/30/21 1548       PT SHORT TERM GOAL #1   Title Patient will be independent with home exercise program for self-management of symptoms.    Time 4    Period Weeks    Status New    Target Date 12/28/21               PT Long Term Goals - 11/30/21 1548       PT LONG TERM GOAL #1   Title Patient will demonstrate reduced falls risk as evidenced by Dynamic Gait Index  (DGI) 22/24 or greater.    Baseline scored 21/24 on 11/29/21;    Time 8    Period Weeks    Status New    Target Date 01/25/22      PT LONG TERM GOAL #2   Title Patient will report 50% or greater improvement in his symptoms of dizziness and imbalance with provoking motions or positions.    Baseline On 11/30/21 pt reports that he gets dizziness with overhand serves in tennis and with bending over and turning around;    Time 8    Period Weeks    Status New    Target Date 01/25/22              Plan - 11/30/21 1546     Clinical Impression Statement This is a 76 y/o patient who was seen today for physical therapy vestibular evaluation and treatment for dizziness and imbalance symptoms. Objective impairments include decreased balance and dizziness. Patient reports episodic imbalance and dizziness symptoms brought on by positional changes. Patient scored 21/24 on the Dynamic gait index. Patient will benefit from skilled PT to address functional impairments and improve overall function.    Personal Factors and Comorbidities Age    Examination-Activity Limitations Stairs    Examination-Participation Restrictions Other   playing tennis at times   Stability/Clinical Decision Making Evolving/Moderate complexity    Clinical Decision Making Moderate    Rehab Potential Excellent    PT Frequency 1x / week    PT Duration 8 weeks    PT Treatment/Interventions Canalith Repostioning;Gait training;Stair training;Therapeutic activities;Therapeutic exercise;Balance training;Neuromuscular re-education;Patient/family education;Vestibular    PT Next Visit Plan review and progress VOR X 1 exercise-try vertical; Airex pad feet together and semi-tandem progressions with head turns, EO/EC, ball pass over shoulder while ambulating, body wall rolls    PT Home Exercise Plan VOR X1 with plain background in standing    Consulted and Agree with Plan of Care Patient             Patient will benefit from skilled  therapeutic intervention in order to improve the following deficits and impairments:  Dizziness, Decreased balance  Visit Diagnosis: Dizziness and giddiness  Unsteadiness on feet     Problem List Patient Active Problem List   Diagnosis Date Noted   Corporo-venous occlusive erectile dysfunction 11/21/2020   Erectile dysfunction 11/19/2019   Erectile dysfunction  due to arterial insufficiency 11/08/2018   Primary open angle glaucoma (POAG) of both eyes 09/20/2017   Nocturia 09/10/2016   Personal history of prostate cancer 10/02/2014   Right inguinal hernia 12/31/2012   Prostate cancer ([place]) 08/21/2010   Lady Deutscher PT, DPT #[id number] Lady Deutscher, PT 12/01/2021, 2:24 PM  [place] MAIN [facility] SERVICES [address] [place], [place], [address] Phone: [phone]   Fax:  [phone]  Name: Jason Mcgee MRN: [id number] Date of Birth: May 28, 1946

## 2021-12-07 ENCOUNTER — Ambulatory Visit: Payer: Medicare HMO | Admitting: Physical Therapy

## 2021-12-14 ENCOUNTER — Other Ambulatory Visit: Payer: Self-pay

## 2021-12-14 ENCOUNTER — Ambulatory Visit: Payer: Medicare HMO | Admitting: Physical Therapy

## 2021-12-14 ENCOUNTER — Encounter: Payer: Self-pay | Admitting: Physical Therapy

## 2021-12-14 DIAGNOSIS — R2681 Unsteadiness on feet: Secondary | ICD-10-CM

## 2021-12-14 DIAGNOSIS — R42 Dizziness and giddiness: Secondary | ICD-10-CM

## 2021-12-14 NOTE — Therapy (Addendum)
[place] MAIN [facility] SERVICES [address] [place], [place], [address] Phone: [phone]   Fax:  [phone]  Physical Therapy Treatment  Patient Details  Name: Jason Mcgee MRN: [id number] Date of Birth: Aug 02, 1946 Referring Provider (PT): Dr. Carmin Richmond   Encounter Date: 12/14/2021   PT End of Session - 12/14/21 1054     Visit Number 2    Number of Visits 9    Date for PT Re-Evaluation 01/25/22    PT Start Time 1055    Equipment Utilized During Treatment Gait belt    Activity Tolerance Patient tolerated treatment well    Behavior During Therapy [facility] for tasks assessed/performed             Past Medical History:  Diagnosis Date   Inguinal hernia 12/31/2012   Malignant neoplasm of prostate ([place]) 08/21/2010   Overview:  [name] Stage 6, s/p 1-125 interstitial implants   Nocturia 09/10/2016   Personal history of prostate cancer 10/02/2014   Primary open angle glaucoma (POAG) of both eyes 09/20/2017    Past Surgical History:  Procedure Laterality Date   INSERTION BRACHYTHERAPY DEVICE  2012   prostate seed implant    There were no vitals filed for this visit.    Subjective Assessment - 12/17/21 0810     Subjective Patient states that he is still playing tennis and he has had no bouts of dizziness lately.  She reports that he has been taking the medication that his physician gave him which is helping to decrease his sinus congestion.  Patient reports that he has done some of his home exercises.    Pertinent History Pt reports he has had dizziness off and on for years, but it has become more prevalent in the past 2 or 3 years. Patient has been seen by Dr. Pryor Ochoa, ENT physician, and has had VNG testing. Pt with diagnosis of Meniere's disease, acute vestibular right ear per MR. Patient states he is having vertigo and balance difficulties which prompted him to see Dr. Pryor Ochoa. Pt reports his episodes of dizziness have been happening  every 3-5 months. During an episode, pt reports he cannot walk in a straight line and feels like he is going to fall. Patient plays tennis and when he has an episode he cannot play. Pt reports that bending over, standing up, looking up at the ceiling, looking up to serve the tennis ball, quick turns, turning around all aggravate his symptoms and that resting, slowing his movements and taking sinus medicine help to decrease his symptoms. These episodes last a few days and he feels fatigued and wants to lie down. He has had 2 episodes this past year. Patient states he uses rails when navigating on stairs for balance. Patient reports that he does not have any hearing issues. Pt states that the unsteadiness makes him unsure and that it affects how he walks. Reports he has difficulty distinguishing sinus difficulties acting up and gets stuffy feeling. Pt reports that he has glaucoma and is not sure if some of symptoms could be due to glaucoma or sinus issues. Pt reports puffy sensation behind his eyes.    Diagnostic tests VNG test    Patient Stated Goals to have improve balance and decreased episodes of vertigo and unsteadiness; to be able to continue playing tennis.              Neuromuscular Re-education:  VOR X 1 exercise:  Patient performed VOR X 1 horizontal in standing  with conflicting background 3 reps of 1 minute each with verbal cues for technique initially.  Added this progression to home exercise program.  Airex pad:  On Airex pad, patient performed feet together progressions and semi-tandem progressions with alternating lead leg with and without horizontal,  vertical and diagonal head turns.  Patient reports mild increase in dizziness and unsteadiness. Added these exercises to home exercise program.  Ambulation with head turns:  Patient performed 70' trials of forwards and retro ambulation with horizontal and vertical head turns with CGA.  Patient describes his dizziness as he feels  about 20% "off " while doing these activities.  Diona Foley toss to self:  Patient performed forward and then retroambulation 75 feet trials while tossing ball to self horizontal and then vertical while tracking ball with head and eyes with contact-guard assistance. Patient reports feeling about 20% "off "with this activity.   Body Wall Rolls:  Patient performed 4 reps of unsupported, body wall rolls with eyes open contact-guard assistance.  Patient had a few times where he touched the wall for support. Patient reports this activity increased dizziness/sensation of feeling off.   Access Code: [facility] URL: [email or web address] Date: 12/14/2021 Prepared by: Lady Deutscher  Exercises Standing Gaze Stabilization with Head Rotation - 3 x daily - 7 x weekly - 1 sets - 3 reps - 1 minute hold Feet together with horizontal head turns - 1 x daily - 7 x weekly - 1 sets - 5 reps - 1 minute hold Tandem stance with vertical head turns - 1 x daily - 7 x weekly - 1 sets - 5 reps - 1 minute hold Tandem stance with horizontal head turns - 1 x daily - 7 x weekly - 1 sets - 5 reps - 1 min hold        PT Short Term Goals - 11/30/21 1548       PT SHORT TERM GOAL #1   Title Patient will be independent with home exercise program for self-management of symptoms.    Time 4    Period Weeks    Status New    Target Date 12/28/21               PT Long Term Goals - 11/30/21 1548       PT LONG TERM GOAL #1   Title Patient will demonstrate reduced falls risk as evidenced by Dynamic Gait Index (DGI) 22/24 or greater.    Baseline scored 21/24 on 11/29/21;    Time 8    Period Weeks    Status New    Target Date 01/25/22      PT LONG TERM GOAL #2   Title Patient will report 50% or greater improvement in his symptoms of dizziness and imbalance with provoking motions or positions.    Baseline On 11/30/21 pt reports that he gets dizziness with overhand serves in tennis and with bending over and  turning around;    Time 8    Period Weeks    Status New    Target Date 01/25/22               Plan - 12/17/21 0819     Clinical Impression Statement Patient returns to clinic reporting that he has had no further episodes of dizziness since last visit.  Patient reports that his been able to remain active playing tennis.  He was able to progress to VOR x1 in standing with conflicting background this date.  Patient was challenged by Airex pad  and ambulation activities with horizontal and vertical head turns as well as by ambulation with ball toss to self with head and eye follows and body wall rolls.  Patient reports that these activities recreated his sensation of feeling "off ".  Next session will plan on reviewing home exercise program, working on progressions of body wall rolls and ambulation with ball toss over shoulder.  Patient would benefit from continued vestibular PT services to further address goals and functional deficits.   Personal Factors and Comorbidities Age    Examination-Activity Limitations Stairs    Examination-Participation Restrictions Other   playing tennis at times   Stability/Clinical Decision Making Evolving/Moderate complexity    Rehab Potential Excellent    PT Frequency 1x / week    PT Duration 8 weeks    PT Treatment/Interventions Canalith Repostioning;Gait training;Stair training;Therapeutic activities;Therapeutic exercise;Balance training;Neuromuscular re-education;Patient/family education;Vestibular    PT Next Visit Plan review and progress VOR X 1 exercise-try vertical; Airex pad feet together and semi-tandem progressions with head turns, EO/EC, ball pass over shoulder while ambulating, body wall rolls    PT Home Exercise Plan VOR X1 with plain background in standing    Consulted and Agree with Plan of Care Patient                   Patient will benefit from skilled therapeutic intervention in order to improve the following deficits and  impairments:     Visit Diagnosis: Unsteadiness on feet  Dizziness and giddiness     Problem List Patient Active Problem List   Diagnosis Date Noted   Corporo-venous occlusive erectile dysfunction 11/21/2020   Erectile dysfunction 11/19/2019   Erectile dysfunction due to arterial insufficiency 11/08/2018   Primary open angle glaucoma (POAG) of both eyes 09/20/2017   Nocturia 09/10/2016   Personal history of prostate cancer 10/02/2014   Right inguinal hernia 12/31/2012   Prostate cancer ([place]) 08/21/2010   Lady Deutscher PT, DPT #[id number]  Lady Deutscher, PT 12/14/2021, 10:54 AM  [place] MAIN [facility] SERVICES [place], [place], [address] Phone: [phone]   Fax:  [phone]  Name: Jason Mcgee MRN: [id number] Date of Birth: 1946/10/19

## 2021-12-21 ENCOUNTER — Ambulatory Visit: Payer: Medicare HMO | Admitting: Physical Therapy

## 2021-12-21 ENCOUNTER — Encounter: Payer: Self-pay | Admitting: Physical Therapy

## 2021-12-21 ENCOUNTER — Other Ambulatory Visit: Payer: Self-pay

## 2021-12-21 DIAGNOSIS — R42 Dizziness and giddiness: Secondary | ICD-10-CM

## 2021-12-21 DIAGNOSIS — R2681 Unsteadiness on feet: Secondary | ICD-10-CM

## 2021-12-21 NOTE — Therapy (Addendum)
[place] MAIN [facility] SERVICES [address] [place], [place], [address] Phone: [phone]   Fax:  [phone]  Physical Therapy Treatment  Patient Details  Name: Jason Mcgee MRN: [id number] Date of Birth: [birth year] Referring Provider (PT): Dr. Carmin Richmond   Encounter Date: 12/21/2021   PT End of Session - 12/21/21 0823     Visit Number 3    Number of Visits 9    Date for PT Re-Evaluation 01/25/22    PT Start Time 0822    PT Stop Time 0910    PT Time Calculation (min) 48 min    Equipment Utilized During Treatment Gait belt    Activity Tolerance Patient tolerated treatment well    Behavior During Therapy [facility] for tasks assessed/performed             Past Medical History:  Diagnosis Date   Inguinal hernia 12/31/2012   Malignant neoplasm of prostate ([place]) 08/21/2010   Overview:  [name] Stage 6, s/p 1-125 interstitial implants   Nocturia 09/10/2016   Personal history of prostate cancer 10/02/2014   Primary open angle glaucoma (POAG) of both eyes 09/20/2017    Past Surgical History:  Procedure Laterality Date   INSERTION BRACHYTHERAPY DEVICE  2012   prostate seed implant    There were no vitals filed for this visit.   Subjective Assessment - 12/21/21 0823     Subjective Patient states that his symptoms have been about the same and states that he can tell a distance in his balance based on how much fluid is in his face which he states changes depending on the weather. Pt states yesterday he felt more fluid in his sinuses. Pt states that he is going to the doctor about his sinus fluid next visit. He is taking nasal spray, Realisis, that the MD prescribed for him which he feels helps some.    Pertinent History Pt reports he has had dizziness off and on for years, but it has become more prevalent in the past 2 or 3 years. Patient has been seen by Dr. Pryor Ochoa, ENT physician, and has had VNG testing. Pt with diagnosis of Meniere's  disease, acute vestibular right ear per MR. Patient states he is having vertigo and balance difficulties which prompted him to see Dr. Pryor Ochoa. Pt reports his episodes of dizziness have been happening every 3-5 months. During an episode, pt reports he cannot walk in a straight line and feels like he is going to fall. Patient plays tennis and when he has an episode he cannot play. Pt reports that bending over, standing up, looking up at the ceiling, looking up to serve the tennis ball, quick turns, turning around all aggravate his symptoms and that resting, slowing his movements and taking sinus medicine help to decrease his symptoms. These episodes last a few days and he feels fatigued and wants to lie down. He has had 2 episodes this past year. Patient states he uses rails when navigating on stairs for balance. Patient reports that he does not have any hearing issues. Pt states that the unsteadiness makes him unsure and that it affects how he walks. Reports he has difficulty distinguishing sinus difficulties acting up and gets stuffy feeling. Pt reports that he has glaucoma and is not sure if some of symptoms could be due to glaucoma or sinus issues. Pt reports puffy sensation behind his eyes.    Diagnostic tests VNG test    Patient Stated Goals to have improve balance  and decreased episodes of vertigo and unsteadiness; to be able to continue playing tennis.               Neuromuscular Re-education:   VOR X 1 exercise:  Patient performed VOR X 1 horizontal in standing with conflicting background 1 rep of vertical and 1 rep of horizontal demonstrating good technique and speed. Patient denies dizziness with this activity.    Ball toss over shoulder: Patient performed multiple 100' trials of forward and retro ambulation while tossing ball over one shoulder with return catch over opposite shoulder with CGA.  Patient performed multiple 100' trials of forward and retro ambulation while tossing ball  over one shoulder with return catch over opposite shoulder varying the ball position to head, shoulder and waist level to promote head turning and tilting with contact-guard assistance. Patient reports that these activities are not as comfortable feeling with some mild recreation of that sensation of feeling "off "but no overt losses of balance or episodes of dizziness.  Patient able to maintain fair cadence while performing and ambulated without veering.  Hallway ball toss:  In hallway, worked on ball toss against one wall with alternating quick turns to toss ball against opposite wall while tracking with eyes and head with verbal cues for head and eye tracking with standby assistance. Patient demonstrates no losses of balance and denies sensation of dizziness or feeling off.  Body Wall Rolls:  Patient performed 5 reps of supported, body wall rolls with eyes closed with contact-guard assistance.  Patient reports sensation of imbalance and needing to touch wall for support but denies dizziness or sensation of feeling "off ". Added body wall rolls to home exercise program with handout provided.  Airex balance beam: On Airex balance beam, patient performed static standing with feet together with horizontal and vertical head turns with eyes open and then with eyes closed multiple reps of about 1 minute each. On Airex balance beam, patient performed sidestepping left/right without head turns and then with horizontal head turns 6 to 8 reps times 5' each.  Patient's balance was challenged by this activity but had no overt losses of balance and patient denied dizziness.   BPPV TESTS:  Symptoms Duration Intensity Nystagmus  Left Dix-Hallpike None N/A N/A None observed  Right Dix-Hallpike None N/A N/A None observed  Left Head Roll None N/A N/A None observed  Right Head Roll None N/A N/A None observed    Note: Portions of this document were prepared using Dragon voice recognition software and  although reviewed may contain unintentional dictation errors in syntax, grammar, or spelling.    PT Education - 12/21/21 0926     Education Details Reviewed VOR x1 in standing with conflicting background and added vertical repetitions and added body wall rolls to home exercise program with handout provided.    Person(s) Educated Patient    Methods Explanation;Verbal cues;Handout    Comprehension Verbalized understanding              PT Short Term Goals - 11/30/21 1548       PT SHORT TERM GOAL #1   Title Patient will be independent with home exercise program for self-management of symptoms.    Time 4    Period Weeks    Status New    Target Date 12/28/21               PT Long Term Goals - 11/30/21 1548       PT LONG TERM GOAL #1  Title Patient will demonstrate reduced falls risk as evidenced by Dynamic Gait Index (DGI) 22/24 or greater.    Baseline scored 21/24 on 11/29/21;    Time 8    Period Weeks    Status New    Target Date 01/25/22      PT LONG TERM GOAL #2   Title Patient will report 50% or greater improvement in his symptoms of dizziness and imbalance with provoking motions or positions.    Baseline On 11/30/21 pt reports that he gets dizziness with overhand serves in tennis and with bending over and turning around;    Time 8    Period Weeks    Status New    Target Date 01/25/22                   Plan - 12/21/21 0947     Clinical Impression Statement Patient reports that he has been able to play tennis 3 times a week again.  Patient able to progress to vertical and horizontal repetitions of VOR x1 in standing with conflicting background without dizziness.  Patient challenged by body wall rolls and by Airex balance beam activities with head turns and with eyes closed at this date.  Performed left and right horizontal and posterior canal testing and all were negative with no nystagmus observed and patient denied vertigo and dizziness symptoms.  Next  session will plan on reviewing home exercise program, working on progressions of Airex balance beam activities and half foam roll. Patient would benefit from continued vestibular PT services to further address goals and functional deficits.    Personal Factors and Comorbidities Age    Examination-Activity Limitations Stairs    Examination-Participation Restrictions Other   playing tennis at times   Stability/Clinical Decision Making Evolving/Moderate complexity    Rehab Potential Excellent    PT Frequency 1x / week    PT Duration 8 weeks    PT Treatment/Interventions Canalith Repostioning;Gait training;Stair training;Therapeutic activities;Therapeutic exercise;Balance training;Neuromuscular re-education;Patient/family education;Vestibular    PT Next Visit Plan review and progress VOR X 1 exercise-try vertical; Airex pad feet together and semi-tandem progressions with head turns, EO/EC, ball pass over shoulder while ambulating, body wall rolls    PT Home Exercise Plan VOR X1 with plain background in standing    Consulted and Agree with Plan of Care Patient             Patient will benefit from skilled therapeutic intervention in order to improve the following deficits and impairments:  Dizziness, Decreased balance  Visit Diagnosis: Dizziness and giddiness  Unsteadiness on feet     Problem List Patient Active Problem List   Diagnosis Date Noted   Corporo-venous occlusive erectile dysfunction 11/21/2020   Erectile dysfunction 11/19/2019   Erectile dysfunction due to arterial insufficiency 11/08/2018   Primary open angle glaucoma (POAG) of both eyes 09/20/2017   Nocturia 09/10/2016   Personal history of prostate cancer 10/02/2014   Right inguinal hernia 12/31/2012   Prostate cancer ([place]) 08/21/2010   Lady Deutscher PT, DPT #[id number] Lady Deutscher, PT 12/21/2021, 4:47 PM  [place] MAIN [facility] SERVICES [address] [place], [place],  [address] Phone: [phone]   Fax:  [phone]  Name: HASKEL DEWALT MRN: [id number] Date of Birth: 1946/02/01

## 2021-12-28 ENCOUNTER — Encounter: Payer: Medicare HMO | Admitting: Physical Therapy

## 2021-12-28 DIAGNOSIS — Z0001 Encounter for general adult medical examination with abnormal findings: Secondary | ICD-10-CM | POA: Diagnosis not present

## 2021-12-29 ENCOUNTER — Other Ambulatory Visit: Payer: Self-pay

## 2021-12-29 ENCOUNTER — Ambulatory Visit: Payer: Medicare HMO | Attending: Otolaryngology | Admitting: Physical Therapy

## 2021-12-29 ENCOUNTER — Encounter: Payer: Self-pay | Admitting: Physical Therapy

## 2021-12-29 DIAGNOSIS — R42 Dizziness and giddiness: Secondary | ICD-10-CM | POA: Insufficient documentation

## 2021-12-29 DIAGNOSIS — R2681 Unsteadiness on feet: Secondary | ICD-10-CM | POA: Diagnosis not present

## 2021-12-29 NOTE — Therapy (Signed)
[place] MAIN [facility] SERVICES [address] [place], [place], [address] Phone: [phone]   Fax:  [phone]  Physical Therapy Treatment  Patient Details  Name: Jason Mcgee MRN: [id number] Date of Birth: 02-04-1946 Referring Provider (PT): Dr. Carmin Richmond   Encounter Date: 12/29/2021   PT End of Session - 12/29/21 1346     Visit Number 4    Number of Visits 9    Date for PT Re-Evaluation 01/25/22    PT Start Time 1346    Equipment Utilized During Treatment Gait belt    Activity Tolerance Patient tolerated treatment well    Behavior During Therapy [facility] for tasks assessed/performed             Past Medical History:  Diagnosis Date   Inguinal hernia 12/31/2012   Malignant neoplasm of prostate ([place]) 08/21/2010   Overview:  [name] Stage 6, s/p 1-125 interstitial implants   Nocturia 09/10/2016   Personal history of prostate cancer 10/02/2014   Primary open angle glaucoma (POAG) of both eyes 09/20/2017    Past Surgical History:  Procedure Laterality Date   INSERTION BRACHYTHERAPY DEVICE  2012   prostate seed implant    There were no vitals filed for this visit.  Neuromuscular Re-education:  Body Wall Rolls:  Patient performed 6 reps of unsupported, body wall rolls with eyes closed with contact-guard assistance. Patient needed to touch wall about 3 times for orientation on body position.  Half-Foam Roll:  On half-foam roll with flat side down and then round side down worked on static holds for 3-5 minutes each. Worked on 1/2 foam roll flat side down static holds with and without horizontal head turns.  Worked on 1/2 foam roll flat side down static holds with side-stepping left/right multiple reps.  Worked on round side down static holds with head turns. Patient requiring assistance ranging from CGA to min A.  Patient most challenged by round side down.  Patient demonstrates good ankle strategies but has difficulty utilizing  hip strategies and defaults to reaching. Patient would benefit from continued practice with developing hip strategies.  Ball toss over shoulder: Patient performed multiple 150' trials of forward and retro ambulation while tossing ball over one shoulder with return catch over opposite shoulder varying the ball position to head, shoulder and waist level to promote head turning and tilting with contact-guard assistance. Patient did well with this activity this date and described no dizziness but reports retroambulation was "not comfortable ".  Patient had no loss of balance or veering noted.  2" X 4" board:   On 2" X 4" board worked on static sideways stance with and without head turns and then worked on sidestepping left/right with and without horizontal and vertical head turns and forward tandem walking 8' times multiple reps of each type.  Patient needed to reach to touch ballet bar a few times for. Patient required CGA with activities on 2 X 4" board.  Patient improved with practice with this activity.  Patient returns to the clinic reporting no episodes of dizziness this past week.  Patient able to progress to higher level standing balance activities this date.  Patient was challenged by half foam roll activities and demonstrates good ankle strategies but would benefit from continued practice to further develop hip strategies and to decrease dependence on reaching for support when balance is challenged.  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  Patient would benefit from continued vestibular PT services to further address functional deficits and goals as stated on plan of care.  Note: Portions of this document were prepared using Dragon voice recognition software and although reviewed may contain unintentional dictation errors in syntax, grammar, or spelling.           PT Short Term Goals -  11/30/21 1548       PT SHORT TERM GOAL #1   Title Patient will be independent with home exercise program for self-management of symptoms.    Time 4    Period Weeks    Status New    Target Date 12/28/21               PT Long Term Goals - 11/30/21 1548       PT LONG TERM GOAL #1   Title Patient will demonstrate reduced falls risk as evidenced by Dynamic Gait Index (DGI) 22/24 or greater.    Baseline scored 21/24 on 11/29/21;    Time 8    Period Weeks    Status New    Target Date 01/25/22      PT LONG TERM GOAL #2   Title Patient will report 50% or greater improvement in his symptoms of dizziness and imbalance with provoking motions or positions.    Baseline On 11/30/21 pt reports that he gets dizziness with overhand serves in tennis and with bending over and turning around;    Time 8    Period Weeks    Status New    Target Date 01/25/22              Plan - 12/31/21 0901     Clinical Impression Statement Patient returns to the clinic reporting no episodes of dizziness this past week.  Patient able to progress to higher level standing balance activities this date.  Patient was challenged by half foam roll activities and demonstrates good ankle strategies but would benefit from continued practice to further develop hip strategies and to decrease dependence on reaching for support when balance is challenged.  Next session will work on [employer] operational officer activities, half foam roll with round side down balance machine and slow marching with head turns.  Patient would benefit from continued vestibular PT services to further address functional deficits and goals as stated on plan of care.    Personal Factors and Comorbidities Age    Examination-Activity Limitations Stairs    Examination-Participation Restrictions Other   playing tennis at times   Stability/Clinical Decision Making Evolving/Moderate complexity    Rehab Potential Excellent    PT Frequency 1x / week    PT Duration 8  weeks    PT Treatment/Interventions Canalith Repostioning;Gait training;Stair training;Therapeutic activities;Therapeutic exercise;Balance training;Neuromuscular re-education;Patient/family education;Vestibular    PT Next Visit Plan review and progress VOR X 1 exercise-try vertical; Airex pad feet together and semi-tandem progressions with head turns, EO/EC, ball pass over shoulder while ambulating, body wall rolls    PT Home Exercise Plan VOR X1 with plain background in standing    Consulted and Agree with Plan of Care Patient                   Patient will benefit from skilled therapeutic intervention in order to improve the following deficits and impairments:     Visit Diagnosis: Dizziness and giddiness  Unsteadiness on feet     Problem List Patient Active Problem List   Diagnosis Date Noted  Corporo-venous occlusive erectile dysfunction 11/21/2020   Erectile dysfunction 11/19/2019   Erectile dysfunction due to arterial insufficiency 11/08/2018   Primary open angle glaucoma (POAG) of both eyes 09/20/2017   Nocturia 09/10/2016   Personal history of prostate cancer 10/02/2014   Right inguinal hernia 12/31/2012   Prostate cancer ([place]) 08/21/2010   Lady Deutscher PT, DPT #[id number] Lady Deutscher, PT 12/29/2021, 1:46 PM  [place] MAIN [facility] SERVICES [address] [place], [place], [address] Phone: [phone]   Fax:  [phone]  Name: Jason Mcgee MRN: [id number] Date of Birth: 09/27/1946

## 2022-01-04 ENCOUNTER — Encounter: Payer: Medicare HMO | Admitting: Physical Therapy

## 2022-01-04 DIAGNOSIS — G8929 Other chronic pain: Secondary | ICD-10-CM | POA: Diagnosis not present

## 2022-01-04 DIAGNOSIS — Z1389 Encounter for screening for other disorder: Secondary | ICD-10-CM | POA: Diagnosis not present

## 2022-01-04 DIAGNOSIS — Z0001 Encounter for general adult medical examination with abnormal findings: Secondary | ICD-10-CM | POA: Diagnosis not present

## 2022-01-04 DIAGNOSIS — H401130 Primary open-angle glaucoma, bilateral, stage unspecified: Secondary | ICD-10-CM | POA: Diagnosis not present

## 2022-01-04 DIAGNOSIS — M25511 Pain in right shoulder: Secondary | ICD-10-CM | POA: Diagnosis not present

## 2022-01-04 DIAGNOSIS — Z8546 Personal history of malignant neoplasm of prostate: Secondary | ICD-10-CM | POA: Diagnosis not present

## 2022-01-04 DIAGNOSIS — Z0000 Encounter for general adult medical examination without abnormal findings: Secondary | ICD-10-CM | POA: Diagnosis not present

## 2022-01-05 ENCOUNTER — Encounter: Payer: Self-pay | Admitting: Physical Therapy

## 2022-01-05 ENCOUNTER — Ambulatory Visit: Payer: Medicare HMO | Admitting: Physical Therapy

## 2022-01-05 ENCOUNTER — Other Ambulatory Visit: Payer: Self-pay

## 2022-01-05 DIAGNOSIS — R42 Dizziness and giddiness: Secondary | ICD-10-CM

## 2022-01-05 DIAGNOSIS — R2681 Unsteadiness on feet: Secondary | ICD-10-CM

## 2022-01-05 NOTE — Therapy (Signed)
[place] MAIN [facility] SERVICES [address] [place], [place], [address] Phone: [phone]   Fax:  [phone]  Physical Therapy Treatment/discharge summary Dates of service: 11/29/2021 - 01/05/2022 Total number of visits: 5  Patient Details  Name: Jason Mcgee MRN: [id number] Date of Birth: 08-Dec-1945 Referring Provider (PT): Dr. Carmin Richmond   Encounter Date: 01/05/2022   PT End of Session - 01/05/22 1351     Visit Number 5    Number of Visits 9    Date for PT Re-Evaluation 01/25/22    PT Start Time 1350    PT Stop Time 1440    PT Time Calculation (min) 50 min    Equipment Utilized During Treatment Gait belt    Activity Tolerance Patient tolerated treatment well    Behavior During Therapy [facility] for tasks assessed/performed             Past Medical History:  Diagnosis Date   Inguinal hernia 12/31/2012   Malignant neoplasm of prostate ([place]) 08/21/2010   Overview:  [name] Stage 6, s/p 1-125 interstitial implants   Nocturia 09/10/2016   Personal history of prostate cancer 10/02/2014   Primary open angle glaucoma (POAG) of both eyes 09/20/2017    Past Surgical History:  Procedure Laterality Date   INSERTION BRACHYTHERAPY DEVICE  2012   prostate seed implant    There were no vitals filed for this visit.   Subjective Assessment - 01/05/22 1353     Subjective Patient states he still feels a little grogginess in his sinus areas and states he does not know if that will ever go completely away and states he has had no further epiosdes of dizziness.  Patient reports that he is more confident in his balance and states he is doing okay with the movements of bending, turning around and with overhand serve in tennis.  Patient feels it is impacted more by his sinus congestion and if he has less congestion he does better.    Pertinent History Pt reports he has had dizziness off and on for years, but it has become more prevalent in the past 2 or  3 years. Patient has been seen by Dr. Pryor Ochoa, ENT physician, and has had VNG testing. Pt with diagnosis of Meniere's disease, acute vestibular right ear per MR. Patient states he is having vertigo and balance difficulties which prompted him to see Dr. Pryor Ochoa. Pt reports his episodes of dizziness have been happening every 3-5 months. During an episode, pt reports he cannot walk in a straight line and feels like he is going to fall. Patient plays tennis and when he has an episode he cannot play. Pt reports that bending over, standing up, looking up at the ceiling, looking up to serve the tennis ball, quick turns, turning around all aggravate his symptoms and that resting, slowing his movements and taking sinus medicine help to decrease his symptoms. These episodes last a few days and he feels fatigued and wants to lie down. He has had 2 episodes this past year. Patient states he uses rails when navigating on stairs for balance. Patient reports that he does not have any hearing issues. Pt states that the unsteadiness makes him unsure and that it affects how he walks. Reports he has difficulty distinguishing sinus difficulties acting up and gets stuffy feeling. Pt reports that he has glaucoma and is not sure if some of symptoms could be due to glaucoma or sinus issues. Pt reports puffy sensation behind his eyes.  Diagnostic tests VNG test    Patient Stated Goals to have improve balance and decreased episodes of vertigo and unsteadiness; to be able to continue playing tennis.                [facility] PT Assessment - 01/05/22 1418       Dynamic Gait Index   Level Surface Normal    Change in Gait Speed Normal    Gait with Horizontal Head Turns Normal    Gait with Vertical Head Turns Normal    Gait and Pivot Turn Normal    Step Over Obstacle Normal    Step Around Obstacles Normal    Steps Normal    Total Score 24             Neuromuscular Re-education:  Rockerboard: On small wooden rocker  board, worked on side to side and anterior/posterior sways with and without horizontal and vertical head turns with contact-guard assistance. Then, on medium wooden rocker board, worked on side to side and anterior posterior sways with and without horizontal and vertical head turns with contact-guard assistance. Patient demonstrating good ankle and hip strategies.  Half-foam roll: On half-foam roll with round side down worked on static holds for 3-5 minutes with and without head turns with contact-guard assistance. Patient using ankle and hip strategies and only reached touch for support a few times. Clinical Test of Sensory Interaction for Balance (CTSIB):  CONDITION TIME STRATEGY SWAY  Eyes open, firm surface 30 seconds ankle +1  Eyes closed, firm surface 30 seconds ankle +1  Eyes open, foam surface 30 seconds ankle +1  Eyes closed, foam surface 30 seconds Ankle, hip +2    FUNCTIONAL OUTCOME MEASURES:  Results Comments  ABC Scale 98.6% Normal safe for community mobility  DGI 24/24 Normal safe for community mobility  FOTO 65/100 Like patients nationally had a score of 56/100    Note: Portions of this document were prepared using Systems analyst and although reviewed may contain unintentional dictation errors in syntax, grammar, or spelling.    PT Education - 01/05/22 1529     Education Details Discussed functional outcome measure testing and compared to prior test, discussed goals and discharge plans including home exercise program.    Person(s) Educated Patient    Methods Explanation    Comprehension Verbalized understanding              PT Short Term Goals - 01/05/22 1431       PT SHORT TERM GOAL #1   Title Patient will be independent with home exercise program for self-management of symptoms.    Time 4    Period Weeks    Status Achieved    Target Date 12/28/21               PT Long Term Goals - 01/05/22 1432       PT LONG TERM GOAL #1    Title Patient will demonstrate reduced falls risk as evidenced by Dynamic Gait Index (DGI) 22/24 or greater.    Baseline scored 21/24 on 11/29/21; scored 24/24 on 2/15    Time 8    Period Weeks    Status Achieved    Target Date 01/25/22      PT LONG TERM GOAL #2   Title Patient will report 50% or greater improvement in his symptoms of dizziness and imbalance with provoking motions or positions.    Baseline On 11/30/21 pt reports that he gets dizziness with overhand serves  in tennis and with bending over and turning around; reports seen some improvement in his "confidence and knowing what is going on" rated 25-30% improved on 2/15; states he is doing okay with the movements of bending, turning around and with overhand serve and tennis.  Patient feels it is impacted more by his sinus congestion and if he has less congestion he does better.    Time 8    Period Weeks    Status Partially Met    Target Date 01/25/22                   Plan - 01/05/22 1545     Clinical Impression Statement Patient returns to clinic reporting that he has had no further episodes of dizziness again this past week and that he is has been able to resume all of his prior activities including playing tennis 3 times a week.  Patient reports that he is doing better with his tennis serve.  Patient demonstrated improvements in being able to use hip strategies for balance this date.  Retested functional outcome measures and patient demonstrated good improvement scoring 24/24 on the dynamic gait index and 98.6% on the ABC scale which indicates decreased falls risk and safe for community mobility.  Patient scored 65/100 on FOTO whereas like patients nationally had a score of 56/100.  Patient has met 1 out of 1 short-term goals and met long-term goal of improvement on the dynamic gait index score.  Patient partially met remaining long-term goal as reports 25 to 30% improvement overall in his symptoms of dizziness and imbalance  with provoking motions or positions such as overhand tennis serve, bending and turning.  Patient reports that he feels that his sinus congestion impacts these movements more than anything else and that his sinus congestion is less then he is better able to perform these activities.  Patient in agreement with discharge from vestibular PT services at this time with plan for patient to continue to play tennis and perform home exercise program.  Will discharge patient at this time.    Personal Factors and Comorbidities Age    Examination-Activity Limitations Stairs    Examination-Participation Restrictions Other   playing tennis at times   Stability/Clinical Decision Making Evolving/Moderate complexity    Rehab Potential Excellent    PT Frequency 1x / week    PT Duration 8 weeks    PT Treatment/Interventions Canalith Repostioning;Gait training;Stair training;Therapeutic activities;Therapeutic exercise;Balance training;Neuromuscular re-education;Patient/family education;Vestibular    PT Next Visit Plan review and progress VOR X 1 exercise-try vertical; Airex pad feet together and semi-tandem progressions with head turns, EO/EC, ball pass over shoulder while ambulating, body wall rolls    PT Home Exercise Plan VOR X1 with plain background in standing    Consulted and Agree with Plan of Care Patient             Patient will benefit from skilled therapeutic intervention in order to improve the following deficits and impairments:  Dizziness, Decreased balance  Visit Diagnosis: Dizziness and giddiness  Unsteadiness on feet   Problem List Patient Active Problem List   Diagnosis Date Noted   Corporo-venous occlusive erectile dysfunction 11/21/2020   Erectile dysfunction 11/19/2019   Erectile dysfunction due to arterial insufficiency 11/08/2018   Primary open angle glaucoma (POAG) of both eyes 09/20/2017   Nocturia 09/10/2016   Personal history of prostate cancer 10/02/2014   Right inguinal  hernia 12/31/2012   Prostate cancer ([place]) 08/21/2010   Lady Deutscher PT, DPT #  [id number] Lady Deutscher, PT 01/05/2022, 3:48 PM  [place] MAIN [facility] SERVICES [address] [place], [place], [address] Phone: [phone]   Fax:  [phone]  Name: Jason Mcgee MRN: [id number] Date of Birth: 04/20/1946

## 2022-01-06 DIAGNOSIS — H2513 Age-related nuclear cataract, bilateral: Secondary | ICD-10-CM | POA: Diagnosis not present

## 2022-01-06 DIAGNOSIS — H401113 Primary open-angle glaucoma, right eye, severe stage: Secondary | ICD-10-CM | POA: Diagnosis not present

## 2022-01-11 ENCOUNTER — Encounter: Payer: Medicare HMO | Admitting: Physical Therapy

## 2022-01-12 ENCOUNTER — Encounter: Payer: Medicare HMO | Admitting: Physical Therapy

## 2022-01-18 ENCOUNTER — Encounter: Payer: Medicare HMO | Admitting: Physical Therapy

## 2022-01-20 DIAGNOSIS — M25511 Pain in right shoulder: Secondary | ICD-10-CM | POA: Diagnosis not present

## 2022-01-20 DIAGNOSIS — M75101 Unspecified rotator cuff tear or rupture of right shoulder, not specified as traumatic: Secondary | ICD-10-CM | POA: Diagnosis not present

## 2022-01-20 DIAGNOSIS — M19011 Primary osteoarthritis, right shoulder: Secondary | ICD-10-CM | POA: Diagnosis not present

## 2022-01-25 ENCOUNTER — Encounter: Payer: Medicare HMO | Admitting: Physical Therapy

## 2022-02-01 ENCOUNTER — Encounter: Payer: Medicare HMO | Admitting: Physical Therapy

## 2022-02-18 ENCOUNTER — Other Ambulatory Visit: Payer: Medicare HMO

## 2022-02-18 DIAGNOSIS — C61 Malignant neoplasm of prostate: Secondary | ICD-10-CM

## 2022-02-19 LAB — PSA: Prostate Specific Ag, Serum: 0.8 ng/mL (ref 0.0–4.0)

## 2022-05-19 DIAGNOSIS — H401113 Primary open-angle glaucoma, right eye, severe stage: Secondary | ICD-10-CM | POA: Diagnosis not present

## 2022-05-25 DIAGNOSIS — H8109 Meniere's disease, unspecified ear: Secondary | ICD-10-CM | POA: Diagnosis not present

## 2022-05-25 DIAGNOSIS — J301 Allergic rhinitis due to pollen: Secondary | ICD-10-CM | POA: Diagnosis not present

## 2022-10-21 ENCOUNTER — Other Ambulatory Visit: Payer: Self-pay | Admitting: Family Medicine

## 2022-10-21 DIAGNOSIS — C61 Malignant neoplasm of prostate: Secondary | ICD-10-CM

## 2022-10-24 ENCOUNTER — Other Ambulatory Visit: Payer: Medicare HMO

## 2022-10-24 DIAGNOSIS — C61 Malignant neoplasm of prostate: Secondary | ICD-10-CM

## 2022-10-25 LAB — PSA: Prostate Specific Ag, Serum: 0.9 ng/mL (ref 0.0–4.0)

## 2022-10-28 ENCOUNTER — Encounter: Payer: Self-pay | Admitting: Urology

## 2022-10-28 ENCOUNTER — Ambulatory Visit: Payer: Medicare HMO | Admitting: Urology

## 2022-10-28 VITALS — BP 129/76 | HR 92 | Ht 73.0 in | Wt 180.0 lb

## 2022-10-28 DIAGNOSIS — N5082 Scrotal pain: Secondary | ICD-10-CM | POA: Diagnosis not present

## 2022-10-28 DIAGNOSIS — N5319 Other ejaculatory dysfunction: Secondary | ICD-10-CM | POA: Diagnosis not present

## 2022-10-28 DIAGNOSIS — Z8546 Personal history of malignant neoplasm of prostate: Secondary | ICD-10-CM

## 2022-10-28 NOTE — Progress Notes (Unsigned)
10/28/2022 12:27 PM   Jason Mcgee 12/06/1945 [id number]  Referring provider: Baxter Hire, MD [place],  [place] [address]  Chief Complaint  Patient presents with   Prostate Cancer   Urologic history: 1.  T1c low risk adenocarcinoma the prostate  -diagnosed 2011; PSA 4.1 at diagnosis -Elected treatment status post brachytherapy October 2011   2.  Erectile dysfunction  HPI: 76 y.o. male who presents for annual follow-up.  At last visit he was complaining of minimal semen after ejaculation without forceful expulsion and gradual leakage of fluid per urethra.  No pain with ejaculation He does have low back pain and notes occasional sharp discomfort in the right hemiscrotum.  This will occasionally awaken him while sleeping Stable lower urinary tract symptoms Denies dysuria, gross hematuria Denies flank, abdominal or pelvic pain PSA 10/24/2022 stable at 0.9  PMH: Past Medical History:  Diagnosis Date   Inguinal hernia 12/31/2012   Malignant neoplasm of prostate ([place]) 08/21/2010   Overview:  [name] Stage 6, s/p 1-125 interstitial implants   Nocturia 09/10/2016   Personal history of prostate cancer 10/02/2014   Primary open angle glaucoma (POAG) of both eyes 09/20/2017    Surgical History: Past Surgical History:  Procedure Laterality Date   INSERTION BRACHYTHERAPY DEVICE  2012   prostate seed implant    Home Medications:  Allergies as of 10/28/2022       Reactions   Timolol Other (See Comments)   CV issues        Medication List        Accurate as of October 28, 2022 12:27 PM. If you have any questions, ask your nurse or doctor.          STOP taking these medications    tadalafil 20 MG tablet Commonly known as: CIALIS Stopped by: [name], MD       TAKE these medications    brimonidine 0.2 % ophthalmic solution Commonly known as: ALPHAGAN   dorzolamide 2 % ophthalmic solution Commonly known as:  TRUSOPT INSTILL 1 DROP INTO BOTH EYES TWICE A DAY   latanoprost 0.005 % ophthalmic solution Commonly known as: XALATAN        Allergies:  Allergies  Allergen Reactions   Timolol Other (See Comments)    CV issues    Family History: Family History  Problem Relation Age of Onset   Prostate cancer Neg Hx    Bladder Cancer Neg Hx    Kidney cancer Neg Hx     Social History:  reports that he has never smoked. He has never used smokeless tobacco. He reports that he does not drink alcohol and does not use drugs.   Physical Exam: BP 129/76   Pulse 92   Ht '6\' 1"'$  (1.854 m)   Wt 180 lb (81.6 kg)   BMI 23.75 kg/m   Constitutional:  Alert, No acute distress. HEENT: [place] AT Respiratory: Normal respiratory effort, no increased work of breathing. GU: Phallus without lesions.  Testes descended bilaterally without masses or tenderness.  Easily reducible right inguinal hernia Psychiatric: Normal mood and affect.   Assessment & Plan:    1.  T1c low risk prostate cancer Status post brachytherapy Stable PSA  2.  Ejaculatory dysfunction Most likely secondary to radiation effects on the prostate Ejaculatory duct obstruction would be less common but could be further evaluated with TRUS prostate Urethral stricture unlikely since he has no voiding complaints He would like to schedule TRUS  3.  Scrotal pain We discussed possibility of neurogenic etiology secondary to lumbar disc disease as he does complain of low back pain He does have a reducible right inguinal hernia which is a less likely etiology   [name], MD  [facility] [address] [place], [place] [address] [phone]

## 2022-11-10 DIAGNOSIS — H401113 Primary open-angle glaucoma, right eye, severe stage: Secondary | ICD-10-CM | POA: Diagnosis not present

## 2022-11-23 ENCOUNTER — Encounter: Payer: Medicare HMO | Admitting: Urology

## 2022-11-23 DIAGNOSIS — R42 Dizziness and giddiness: Secondary | ICD-10-CM | POA: Diagnosis not present

## 2022-11-23 DIAGNOSIS — H8101 Meniere's disease, right ear: Secondary | ICD-10-CM | POA: Diagnosis not present

## 2022-11-23 DIAGNOSIS — H8109 Meniere's disease, unspecified ear: Secondary | ICD-10-CM | POA: Diagnosis not present

## 2022-11-23 DIAGNOSIS — J301 Allergic rhinitis due to pollen: Secondary | ICD-10-CM | POA: Diagnosis not present

## 2022-11-23 DIAGNOSIS — H04221 Epiphora due to insufficient drainage, right lacrimal gland: Secondary | ICD-10-CM | POA: Diagnosis not present

## 2022-12-01 ENCOUNTER — Ambulatory Visit (INDEPENDENT_AMBULATORY_CARE_PROVIDER_SITE_OTHER): Payer: Medicare HMO | Admitting: Urology

## 2022-12-01 ENCOUNTER — Encounter: Payer: Self-pay | Admitting: Urology

## 2022-12-01 VITALS — BP 150/85 | HR 89 | Ht 73.0 in | Wt 180.0 lb

## 2022-12-01 DIAGNOSIS — N5319 Other ejaculatory dysfunction: Secondary | ICD-10-CM | POA: Diagnosis not present

## 2022-12-01 NOTE — Progress Notes (Signed)
   Refer to my prior office note 10/28/2022.  Jason Mcgee presents today for TRUS prostate.  Description: He was placed in the left lateral decubitus position with knees to chest.  DRE was remarkable for a small prostate without nodules or induration with estimated volume 25 cc. A transrectal ultrasound probe was lubricated and gently placed per rectum.  Prostate was imaged in the sagittal and transverse views.  No echogenic abnormalities were identified.  Prior brachytherapy seeds were visualized throughout the prostate.  Volume was calculated at 25.6 cc.  Seminal vesicles were normal in appearance without evidence of dilation, cyst or ejaculatory duct dilation.  Impression: Small prostate consistent with prior radiation therapy As suspected we discussed his decreased semen volume is due to prior radiation/prostate cancer treatment He will keep his regularly scheduled follow-up   John Giovanni, MD

## 2022-12-04 IMAGING — MR MR BRAIN/IAC WO/W CM
10 of 13 series · 26 of 48 positions shown · IV contrast (gadavist)
Comparison: Brain MRI 03/31/2006.

CLINICAL DATA: 74-year-old male with vertigo and balance problems
for 6 months. Sinus problems, headache behind the eyes.

EXAM:
MRI HEAD WITHOUT AND WITH CONTRAST
TECHNIQUE: Multiplanar, multiecho pulse sequences of the brain and surrounding
structures were obtained without and with intravenous contrast.
CONTRAST:  8mL GADAVIST GADOBUTROL 1 MMOL/ML IV SOLN

[Series 5: T1 · sagittal · 5.0mm · 0.62mm/px · 1 of 25 slices shown (1 of 3)]
[im 1/25]
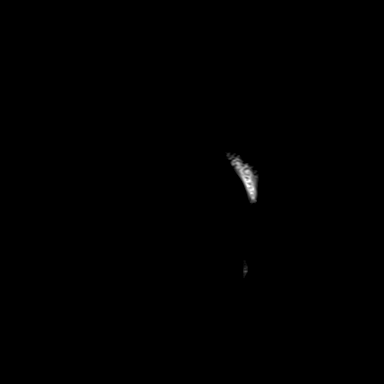

[Series 6: ax dwi_tracew · axial · 3.0mm · 0.65mm/px · z∈[-85,+69]mm · 4 of 48 slices shown]
[im 1/48]
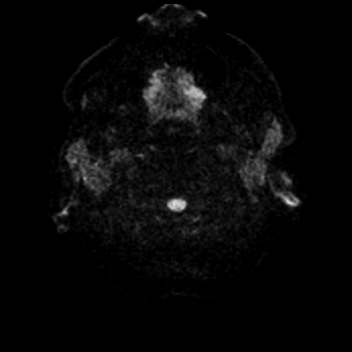
[im 16/48]
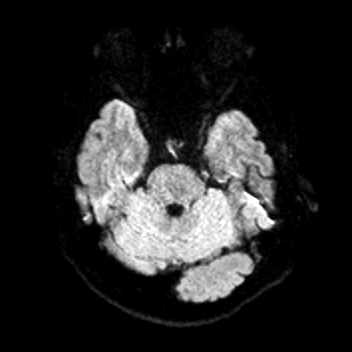
[im 32/48]
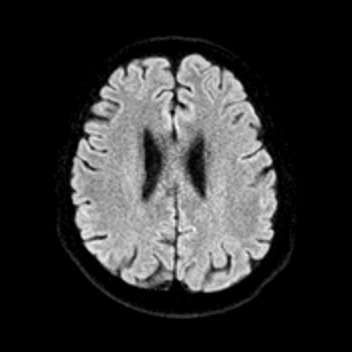
[im 48/48]
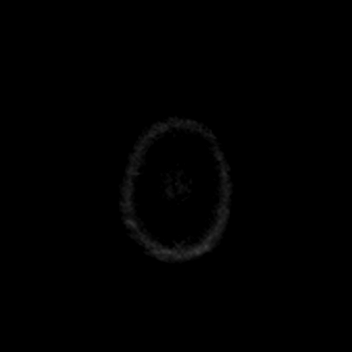

[Series 7: ax dwi_adc · axial · 3.0mm · 0.65mm/px · z∈[-85,+16]mm · 3 of 48 slices shown]
[im 1/48]
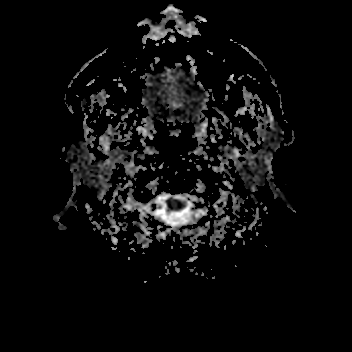
[im 16/48]
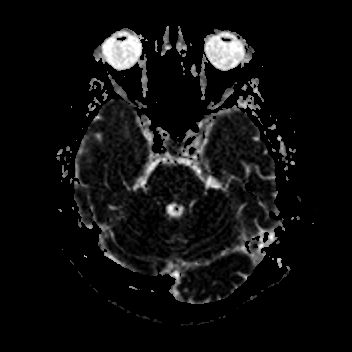
[im 32/48]
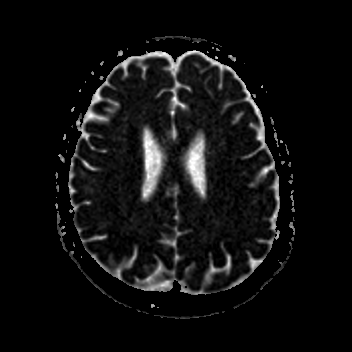

[Series 8: T2 · axial · 5.0mm · 0.53mm/px · z∈[-78,+64]mm · 2 of 25 slices shown]
[im 1/25]
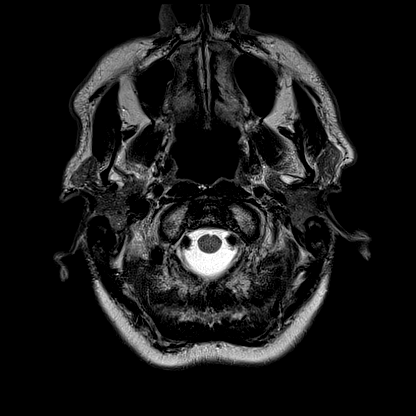
[im 25/25]
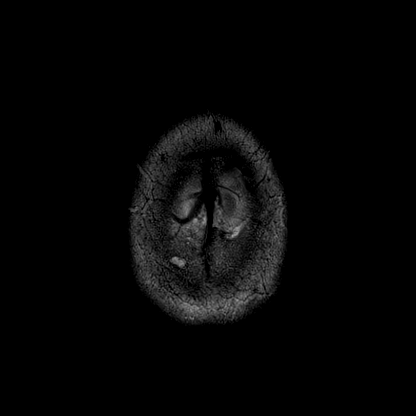

[Series 13: FLAIR · axial · 3.0mm · 0.53mm/px · z∈[-87,+73]mm · 4 of 55 slices shown]
[im 1/55]
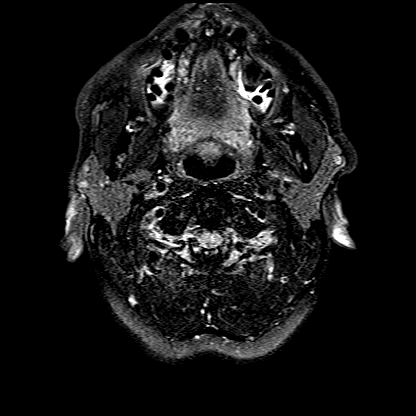
[im 19/55]
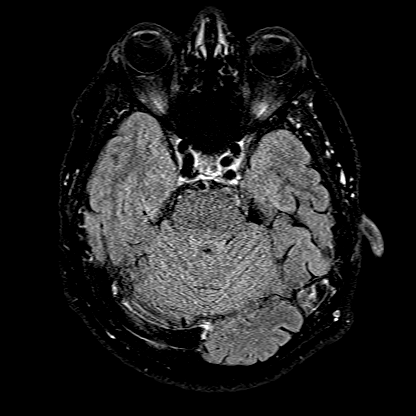
[im 37/55]
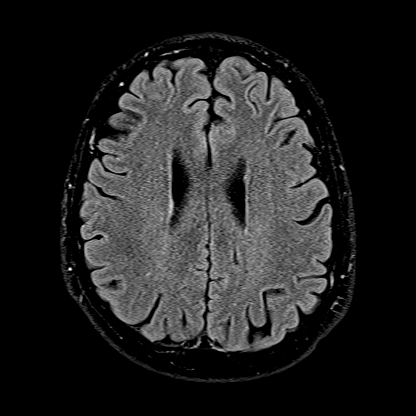
[im 55/55]
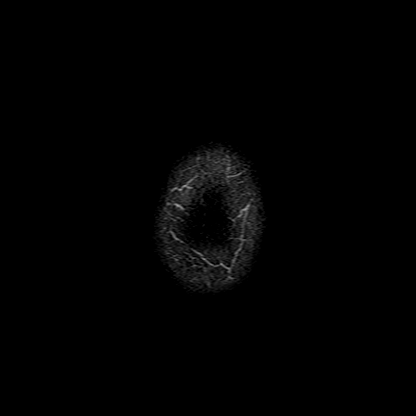

[Series 14: T1 · coronal · non-contrast · 3.0mm · 0.21mm/px · 1 of 13 slices shown (2 of 3)]
[im 1/13]
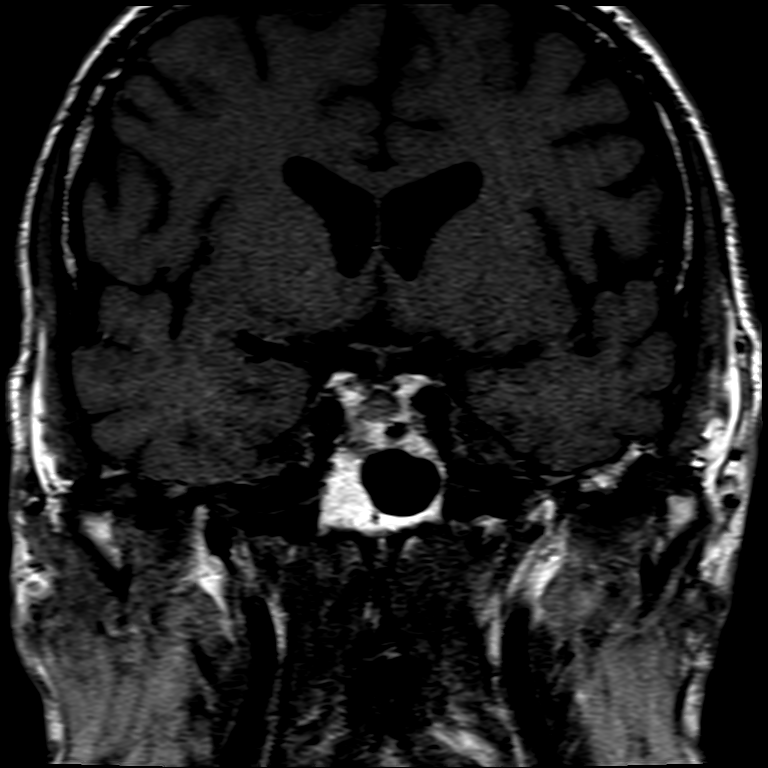

[Series 16: T1 · axial · non-contrast · 3.0mm · 0.21mm/px · 1 of 15 slices shown (3 of 3)]
[im 1/15]
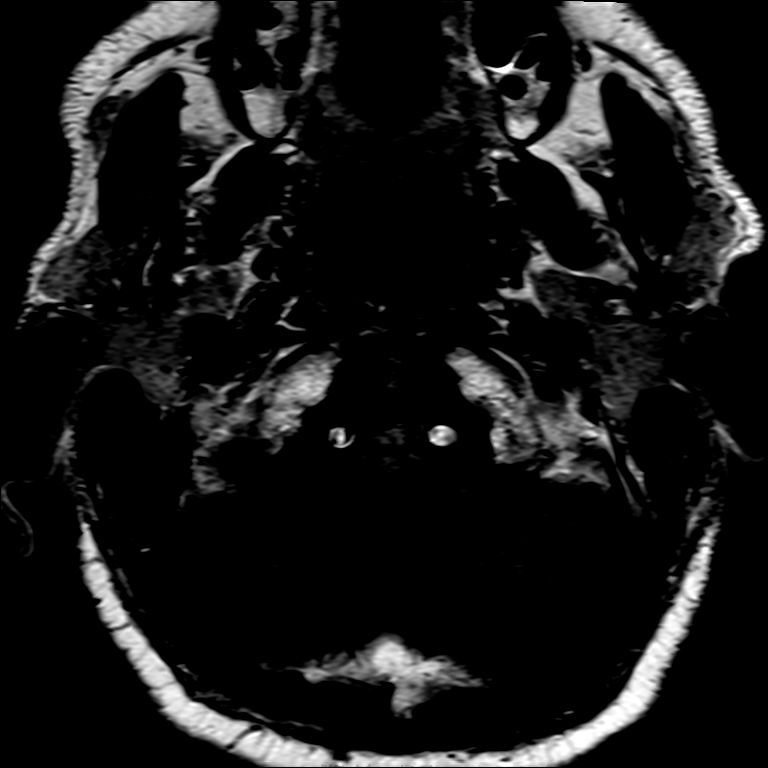

[Series 17: T1 post-contrast · axial · 3.0mm · 0.21mm/px · 1 of 15 slices shown (1 of 3)]
[im 1/15]
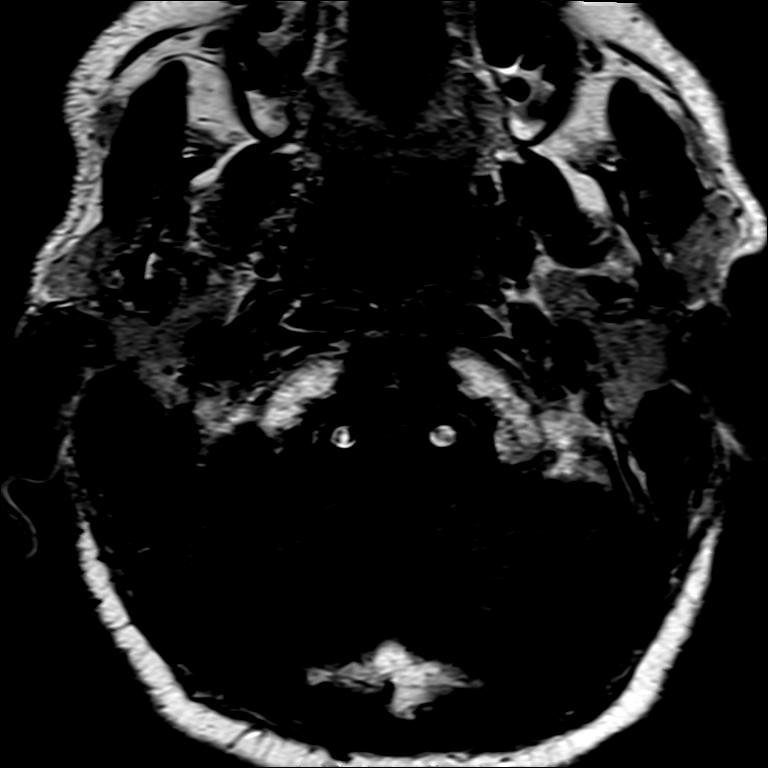

[Series 18: T1 post-contrast · coronal · 3.0mm · 0.21mm/px · 1 of 13 slices shown (2 of 3)]
[im 1/13]
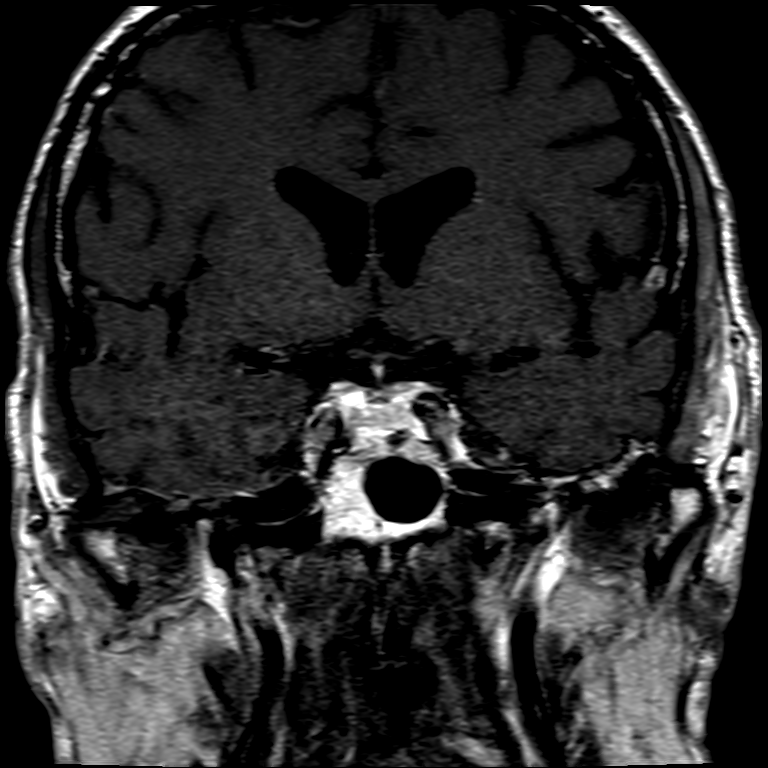

[Series 19: T1 post-contrast · axial · 1.0mm · 0.98mm/px · z∈[-96,+77]mm · 8 of 176 slices shown (3 of 3)]
[im 1/176]
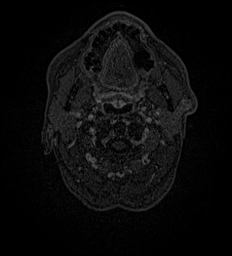
[im 27/176]
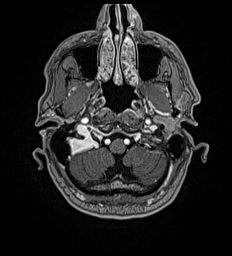
[im 54/176]
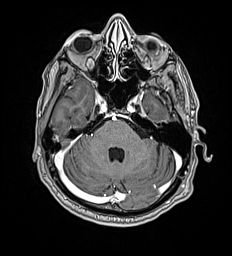
[im 81/176]
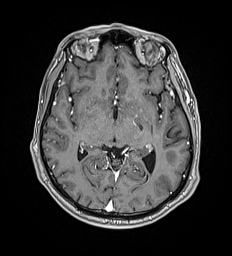
[im 95/176]
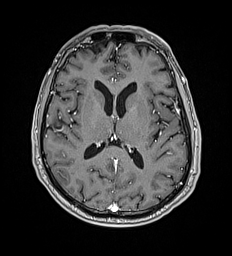
[im 122/176]
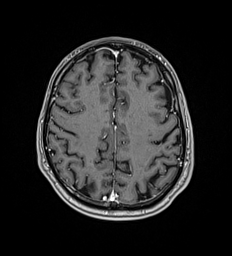
[im 149/176]
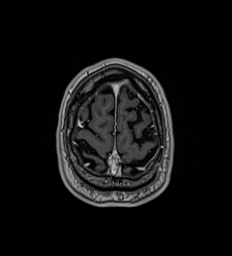
[im 176/176]
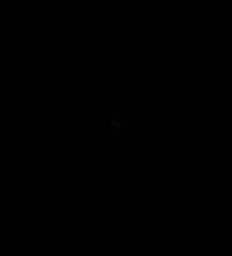

[26 of 48 positions shown; findings below may reference images not displayed]

FINDINGS: Brain: No restricted diffusion to suggest acute infarction. No
midline shift, mass effect, evidence of mass lesion,
ventriculomegaly, extra-axial collection or acute intracranial
hemorrhage. Cervicomedullary junction and pituitary are within
normal limits.

Largely normal for age gray and white matter signal throughout the
brain, minimal nonspecific white matter T2 and FLAIR hyperintensity
when compared to 8770. No cortical encephalomalacia. No chronic
cerebral blood products. No abnormal intracranial enhancement or
dural thickening identified.

Vascular: Major intracranial vascular flow voids are stable since
8770.

Skull and upper cervical spine: Normal for age visible cervical
spine. Normal bone marrow signal.

Sinuses/Orbits: Negative orbits.  Paranasal sinuses are clear.

Other: Dedicated internal auditory imaging. Normal cerebellopontine
angles aside from tortuous distal vertebral arteries and
vertebrobasilar junction which is located on the left, but no
definite brainstem mass effect. Normal bilateral cisternal and
intracanalicular 7th and 8th cranial nerve segments. Symmetric T2
signal in the bilateral cochlea and vestibular structures. No
abnormal enhancement identified. IV contrast is most apparent on the
whole brain postcontrast images series 19. Mastoids are stable since
8770 and clear. Stylomastoid foramina are within normal limits. No
parotid gland abnormality identified. No skull base abnormality
identified.
IMPRESSION: 1. Normal internal auditory imaging.

2. No acute intracranial abnormality and largely normal for age MRI
appearance of the brain. Paranasal sinuses are normal.

## 2022-12-30 DIAGNOSIS — Z0000 Encounter for general adult medical examination without abnormal findings: Secondary | ICD-10-CM | POA: Diagnosis not present

## 2023-01-05 DIAGNOSIS — Z1331 Encounter for screening for depression: Secondary | ICD-10-CM | POA: Diagnosis not present

## 2023-01-05 DIAGNOSIS — Z8546 Personal history of malignant neoplasm of prostate: Secondary | ICD-10-CM | POA: Diagnosis not present

## 2023-01-05 DIAGNOSIS — Z0001 Encounter for general adult medical examination with abnormal findings: Secondary | ICD-10-CM | POA: Diagnosis not present

## 2023-01-05 DIAGNOSIS — R209 Unspecified disturbances of skin sensation: Secondary | ICD-10-CM | POA: Diagnosis not present

## 2023-01-05 DIAGNOSIS — Z0000 Encounter for general adult medical examination without abnormal findings: Secondary | ICD-10-CM | POA: Diagnosis not present

## 2023-01-05 DIAGNOSIS — H401130 Primary open-angle glaucoma, bilateral, stage unspecified: Secondary | ICD-10-CM | POA: Diagnosis not present

## 2023-02-15 DIAGNOSIS — R209 Unspecified disturbances of skin sensation: Secondary | ICD-10-CM | POA: Diagnosis not present

## 2023-03-15 DIAGNOSIS — J301 Allergic rhinitis due to pollen: Secondary | ICD-10-CM | POA: Diagnosis not present

## 2023-03-15 DIAGNOSIS — H6983 Other specified disorders of Eustachian tube, bilateral: Secondary | ICD-10-CM | POA: Diagnosis not present

## 2023-03-15 DIAGNOSIS — R42 Dizziness and giddiness: Secondary | ICD-10-CM | POA: Diagnosis not present

## 2023-03-21 DIAGNOSIS — M25561 Pain in right knee: Secondary | ICD-10-CM | POA: Diagnosis not present

## 2023-03-29 DIAGNOSIS — M25561 Pain in right knee: Secondary | ICD-10-CM | POA: Diagnosis not present

## 2023-05-11 DIAGNOSIS — H401113 Primary open-angle glaucoma, right eye, severe stage: Secondary | ICD-10-CM | POA: Diagnosis not present

## 2023-06-22 DIAGNOSIS — M1711 Unilateral primary osteoarthritis, right knee: Secondary | ICD-10-CM | POA: Diagnosis not present

## 2023-07-19 DIAGNOSIS — J301 Allergic rhinitis due to pollen: Secondary | ICD-10-CM | POA: Diagnosis not present

## 2023-07-19 DIAGNOSIS — H818X1 Other disorders of vestibular function, right ear: Secondary | ICD-10-CM | POA: Diagnosis not present

## 2023-10-30 ENCOUNTER — Encounter: Payer: Self-pay | Admitting: Urology

## 2023-10-30 ENCOUNTER — Ambulatory Visit: Payer: Medicare HMO | Admitting: Urology

## 2023-10-30 VITALS — BP 131/79 | HR 77 | Ht 73.0 in | Wt 180.0 lb

## 2023-10-30 DIAGNOSIS — C61 Malignant neoplasm of prostate: Secondary | ICD-10-CM | POA: Diagnosis not present

## 2023-10-30 DIAGNOSIS — Z8546 Personal history of malignant neoplasm of prostate: Secondary | ICD-10-CM | POA: Diagnosis not present

## 2023-10-30 DIAGNOSIS — K4090 Unilateral inguinal hernia, without obstruction or gangrene, not specified as recurrent: Secondary | ICD-10-CM

## 2023-10-30 DIAGNOSIS — M545 Low back pain, unspecified: Secondary | ICD-10-CM | POA: Diagnosis not present

## 2023-10-30 NOTE — Progress Notes (Signed)
I, Maysun Anabel Bene, acting as a scribe for Riki Altes, MD., have documented all relevant documentation on the behalf of Riki Altes, MD, as directed by Riki Altes, MD while in the presence of Riki Altes, MD.  10/30/2023 1:07 PM   Jason Mcgee 11/14/1946 [id number]  Referring provider: Gracelyn Nurse, MD [address] [facility] [address] [facility] [place],  [place] [address]  Chief Complaint  Patient presents with   Elevated PSA   Urologic history: 1.  T1c low risk adenocarcinoma the prostate  diagnosed 2011; PSA 4.1 at diagnosis Elected treatment status post brachytherapy October 2011   2.  Erectile dysfunction  HPI: Jason Mcgee is a 77 y.o. male for follow-up visit.   Since last year's visit, he has been complaining of intermittent right low back pain, which when present does worsen with movement and with crossing his legs. He was inquiring if this could be kidney-related. A urinalysis February 2024 was unremarkable. He also has noted a bulge in his right lower abdomen and was inquiring if this could be anything serious. He has no pain or discomfort.  Stable lower urinary tract symptoms Denies dysuria, gross hematuria Denies flank, abdominal or pelvic pain PSA has not been checked this year.   PMH: Past Medical History:  Diagnosis Date   Inguinal hernia 12/31/2012   Malignant neoplasm of prostate ([facility]) 08/21/2010   Overview:  [place] Stage 6, s/p 1-125 interstitial implants   Nocturia 09/10/2016   Personal history of prostate cancer 10/02/2014   Primary open angle glaucoma (POAG) of both eyes 09/20/2017    Surgical History: Past Surgical History:  Procedure Laterality Date   INSERTION BRACHYTHERAPY DEVICE  2012   prostate seed implant    Home Medications:  Allergies as of 10/30/2023       Reactions   Timolol Other (See Comments)   CV issues        Medication List        Accurate as of October 30, 2023  1:07 PM. If you have any  questions, ask your nurse or doctor.          brimonidine 0.2 % ophthalmic solution Commonly known as: ALPHAGAN   dorzolamide 2 % ophthalmic solution Commonly known as: TRUSOPT INSTILL 1 DROP INTO BOTH EYES TWICE A DAY   latanoprost 0.005 % ophthalmic solution Commonly known as: XALATAN        Allergies:  Allergies  Allergen Reactions   Timolol Other (See Comments)    CV issues    Family History: Family History  Problem Relation Age of Onset   Prostate cancer Neg Hx    Bladder Cancer Neg Hx    Kidney cancer Neg Hx     Social History:  reports that he has never smoked. He has never used smokeless tobacco. He reports that he does not drink alcohol and does not use drugs.   Physical Exam: BP 131/79   Pulse 77   Ht 6\' 1"  (1.854 m)   Wt 180 lb (81.6 kg)   BMI 23.75 kg/m   Constitutional:  Alert and oriented, No acute distress. HEENT: [place] AT, moist mucus membranes.  Trachea midline, no masses. Cardiovascular: No clubbing, cyanosis, or edema. Respiratory: Normal respiratory effort, no increased work of breathing. GI: Abdomen soft, easily reducible right inguinal hernia,  Skin: No rashes, bruises or suspicious lesions. Neurologic: Grossly intact, no focal deficits, moving all 4 extremities. Psychiatric: Normal mood and affect.   Assessment &  Plan:    1. History prostate cancer-T1c low risk Status post brachytherapy PSA drawn today, and he will be notified with results Continue annual follow-up  2. Right inguinal hernia Asymptomatic General surgery evaluation should he develop symptoms. We discussed signs and symptoms of incarcerated hernia.   3. Right low back pain We discussed this is consistent with musculoskeletal etiology and unlikely GU-related.  Recommend he touch base with PCP regarding further evaluation/management  [facility] [address] [place], [place] [address] [phone]

## 2023-10-31 LAB — PSA: Prostate Specific Ag, Serum: 1 ng/mL (ref 0.0–4.0)

## 2023-11-01 ENCOUNTER — Encounter: Payer: Self-pay | Admitting: Urology

## 2023-11-09 DIAGNOSIS — H401113 Primary open-angle glaucoma, right eye, severe stage: Secondary | ICD-10-CM | POA: Diagnosis not present

## 2024-01-01 DIAGNOSIS — Z125 Encounter for screening for malignant neoplasm of prostate: Secondary | ICD-10-CM | POA: Diagnosis not present

## 2024-01-01 DIAGNOSIS — R7989 Other specified abnormal findings of blood chemistry: Secondary | ICD-10-CM | POA: Diagnosis not present

## 2024-01-01 DIAGNOSIS — R7309 Other abnormal glucose: Secondary | ICD-10-CM | POA: Diagnosis not present

## 2024-01-08 DIAGNOSIS — Z0000 Encounter for general adult medical examination without abnormal findings: Secondary | ICD-10-CM | POA: Diagnosis not present

## 2024-01-08 DIAGNOSIS — Z0001 Encounter for general adult medical examination with abnormal findings: Secondary | ICD-10-CM | POA: Diagnosis not present

## 2024-01-08 DIAGNOSIS — Z1331 Encounter for screening for depression: Secondary | ICD-10-CM | POA: Diagnosis not present

## 2024-01-23 DIAGNOSIS — H818X1 Other disorders of vestibular function, right ear: Secondary | ICD-10-CM | POA: Diagnosis not present

## 2024-01-23 DIAGNOSIS — H6123 Impacted cerumen, bilateral: Secondary | ICD-10-CM | POA: Diagnosis not present

## 2024-01-23 DIAGNOSIS — J301 Allergic rhinitis due to pollen: Secondary | ICD-10-CM | POA: Diagnosis not present

## 2024-05-06 DIAGNOSIS — H401113 Primary open-angle glaucoma, right eye, severe stage: Secondary | ICD-10-CM | POA: Diagnosis not present

## 2024-07-23 DIAGNOSIS — J301 Allergic rhinitis due to pollen: Secondary | ICD-10-CM | POA: Diagnosis not present

## 2024-07-23 DIAGNOSIS — H818X1 Other disorders of vestibular function, right ear: Secondary | ICD-10-CM | POA: Diagnosis not present

## 2024-10-30 ENCOUNTER — Ambulatory Visit: Payer: Self-pay | Admitting: Urology

## 2024-11-19 ENCOUNTER — Encounter: Payer: Self-pay | Admitting: Urology

## 2024-11-19 ENCOUNTER — Ambulatory Visit: Admitting: Urology

## 2024-11-19 VITALS — BP 120/81 | HR 83 | Ht 73.0 in | Wt 180.0 lb

## 2024-11-19 DIAGNOSIS — N5235 Erectile dysfunction following radiation therapy: Secondary | ICD-10-CM | POA: Diagnosis not present

## 2024-11-19 DIAGNOSIS — Z8546 Personal history of malignant neoplasm of prostate: Secondary | ICD-10-CM | POA: Diagnosis not present

## 2024-11-19 MED ORDER — TADALAFIL 20 MG PO TABS: ORAL_TABLET | ORAL | 0 refills | Status: DC

## 2024-11-19 MED ORDER — TADALAFIL 20 MG PO TABS: ORAL_TABLET | ORAL | 0 refills | Status: AC

## 2024-11-19 NOTE — Progress Notes (Signed)
" ° °  11/19/2024 7:53 AM   Jason Mcgee Door 1945-12-27 [id number]  Referring provider: Rudolpho Norleen BIRCH, MD [address] [facility] [place],  [place] [address]  Chief Complaint  Patient presents with   Elevated PSA   Urologic history: 1.  T1c low risk adenocarcinoma the prostate  diagnosed 2011; PSA 4.1 at diagnosis Elected treatment status post brachytherapy October 2011   2.  Erectile dysfunction  HPI: Jason Mcgee is a 78 y.o. male presents for annual follow-up.  No complaints since last year's visit No bothersome LUTS No dysuria or gross hematuria No flank, abdominal or pelvic pain Did want to retry tadalafil  for ED  PSA trend    Prostate Specific Ag, Serum  Latest Ref Rng 0.0 - 4.0 ng/mL  12/10/2016 0.6  12/07/2017 0.5  12/11/2018 0.5  11/18/2020 0.6  11/18/2021 1.0  02/18/2022 0.8  10/24/2022 0.9  10/30/2023 1.0    PMH: Past Medical History:  Diagnosis Date   Inguinal hernia 12/31/2012   Malignant neoplasm of prostate (HCC) 08/21/2010   Overview:  Wess Stage 6, s/p 1-125 interstitial implants   Nocturia 09/10/2016   Personal history of prostate cancer 10/02/2014   Primary open angle glaucoma (POAG) of both eyes 09/20/2017    Surgical History: Past Surgical History:  Procedure Laterality Date   INSERTION BRACHYTHERAPY DEVICE  2012   prostate seed implant    Home Medications:  Allergies as of 11/19/2024       Reactions   Timolol Other (See Comments)   CV issues        Medication List        Accurate as of November 19, 2024  7:53 AM. If you have any questions, ask your nurse or doctor.          brimonidine 0.2 % ophthalmic solution Commonly known as: ALPHAGAN   dorzolamide 2 % ophthalmic solution Commonly known as: TRUSOPT INSTILL 1 DROP INTO BOTH EYES TWICE A DAY   latanoprost 0.005 % ophthalmic solution Commonly known as: XALATAN   loratadine 10 MG tablet Commonly known as: CLARITIN Take 10 mg by mouth.         Allergies: Allergies[1]  Family History: Family History  Problem Relation Age of Onset   Prostate cancer Neg Hx    Bladder Cancer Neg Hx    Kidney cancer Neg Hx     Social History:  reports that he has never smoked. He has never used smokeless tobacco. He reports that he does not drink alcohol  and does not use drugs.   Physical Exam: BP 120/81   Pulse 83   Ht 6' 1 (1.854 m)   Wt 180 lb (81.6 kg)   BMI 23.75 kg/m   Constitutional:  Alert, No acute distress. HEENT: [place] AT Respiratory: Normal respiratory effort, no increased work of breathing. Psychiatric: Normal mood and affect.    Assessment & Plan:    1.  Personal history of prostate cancer 14 years s/p brachytherapy for low risk prostate cancer PSA doubling time ~5 years PSA drawn today Continue annual follow-up unless PSA rising  2.  Erectile dysfunction Rx tadalafil  20 mg 30-60 minutes prior to sexual activity sent May titrate to 40 mg if needed   Jason JAYSON Barba, MD  [facility] [address] [place], [place] [address] [phone]    [1]  Allergies Allergen Reactions   Timolol Other (See Comments)    CV issues   "

## 2024-11-20 ENCOUNTER — Ambulatory Visit: Payer: Self-pay | Admitting: Urology

## 2024-11-20 DIAGNOSIS — Z8546 Personal history of malignant neoplasm of prostate: Secondary | ICD-10-CM

## 2024-11-20 LAB — PSA: Prostate Specific Ag, Serum: 1.4 ng/mL (ref 0.0–4.0)

## 2025-02-20 ENCOUNTER — Other Ambulatory Visit

## 2025-11-19 ENCOUNTER — Ambulatory Visit: Admitting: Urology
